# Patient Record
Sex: Female | Born: 1950 | Race: White | Hispanic: No | Marital: Married | Smoking: Never smoker
Health system: Southern US, Community
[De-identification: ages and names within clinical notes are randomized; demographics above are authoritative.]

## PROBLEM LIST (undated history)

## (undated) DIAGNOSIS — E7800 Pure hypercholesterolemia, unspecified: Secondary | ICD-10-CM

## (undated) DIAGNOSIS — B3781 Candidal esophagitis: Secondary | ICD-10-CM

## (undated) DIAGNOSIS — I341 Nonrheumatic mitral (valve) prolapse: Secondary | ICD-10-CM

## (undated) DIAGNOSIS — J45909 Unspecified asthma, uncomplicated: Secondary | ICD-10-CM

## (undated) DIAGNOSIS — M1990 Unspecified osteoarthritis, unspecified site: Secondary | ICD-10-CM

## (undated) DIAGNOSIS — K635 Polyp of colon: Secondary | ICD-10-CM

## (undated) DIAGNOSIS — K52831 Collagenous colitis: Secondary | ICD-10-CM

## (undated) DIAGNOSIS — K648 Other hemorrhoids: Secondary | ICD-10-CM

## (undated) HISTORY — PX: POLYPECTOMY: SHX149

## (undated) HISTORY — PX: WISDOM TOOTH EXTRACTION: SHX21

## (undated) HISTORY — DX: Pure hypercholesterolemia, unspecified: E78.00

## (undated) HISTORY — DX: Collagenous colitis: K52.831

## (undated) HISTORY — PX: VAGINAL HYSTERECTOMY: SUR661

## (undated) HISTORY — DX: Unspecified osteoarthritis, unspecified site: M19.90

## (undated) HISTORY — DX: Nonrheumatic mitral (valve) prolapse: I34.1

## (undated) HISTORY — DX: Other hemorrhoids: K64.8

## (undated) HISTORY — PX: COLONOSCOPY: SHX174

## (undated) HISTORY — DX: Unspecified asthma, uncomplicated: J45.909

## (undated) HISTORY — DX: Candidal esophagitis: B37.81

## (undated) HISTORY — DX: Polyp of colon: K63.5

## (undated) HISTORY — PX: TUBAL LIGATION: SHX77

## (undated) HISTORY — PX: EYE SURGERY: SHX253

---

## 2017-06-28 ENCOUNTER — Encounter: Payer: Self-pay | Admitting: Internal Medicine

## 2017-07-06 ENCOUNTER — Telehealth: Payer: Self-pay | Admitting: Internal Medicine

## 2017-07-06 NOTE — Telephone Encounter (Signed)
ROI faxed to [place]

## 2017-07-09 ENCOUNTER — Telehealth: Payer: Self-pay | Admitting: Internal Medicine

## 2017-07-09 NOTE — Telephone Encounter (Signed)
Received 32 Pages from [facility] [phone] mc

## 2017-08-26 ENCOUNTER — Ambulatory Visit (INDEPENDENT_AMBULATORY_CARE_PROVIDER_SITE_OTHER): Payer: Medicare Other | Admitting: Internal Medicine

## 2017-08-26 ENCOUNTER — Encounter: Payer: Self-pay | Admitting: Internal Medicine

## 2017-08-26 ENCOUNTER — Encounter (INDEPENDENT_AMBULATORY_CARE_PROVIDER_SITE_OTHER): Payer: Self-pay

## 2017-08-26 VITALS — BP 114/70 | HR 64 | Ht 63.39 in | Wt 131.0 lb

## 2017-08-26 DIAGNOSIS — Z86010 Personal history of colonic polyps: Secondary | ICD-10-CM

## 2017-08-26 DIAGNOSIS — K52831 Collagenous colitis: Secondary | ICD-10-CM | POA: Diagnosis not present

## 2017-08-26 DIAGNOSIS — K219 Gastro-esophageal reflux disease without esophagitis: Secondary | ICD-10-CM | POA: Diagnosis not present

## 2017-08-26 DIAGNOSIS — R197 Diarrhea, unspecified: Secondary | ICD-10-CM

## 2017-08-26 MED ORDER — DIPHENOXYLATE-ATROPINE 2.5-0.025 MG PO TABS: 1.0000 | ORAL_TABLET | Freq: Three times a day (TID) | ORAL | 0 refills | Status: DC | PRN

## 2017-08-26 MED ORDER — BUDESONIDE 3 MG PO CPEP: 9.0000 mg | ORAL_CAPSULE | Freq: Every day | ORAL | 3 refills | Status: DC

## 2017-08-26 NOTE — Patient Instructions (Signed)
We have sent the following medications to your pharmacy for you to pick up at your convenience:  Lomotil, Budesonide  Please follow up in one year

## 2017-08-26 NOTE — Progress Notes (Signed)
HISTORY OF PRESENT ILLNESS:  Tammy Barry is a 66 y.o. female with a history of collagenous colitis, GERD, and adenomatous colon polyps who presents today to establish GI care and wishes ongoing management of her GI conditions. Multiple outside records (33 pages) from [facility]) have been reviewed. Previous GI physician was Dr. Leverne Humbles M.D.. In summary, the patient underwent EGD in January 2010 to evaluate GERD symptoms and globus type sensation. Upper endoscopy was grossly unremarkable. Biopsies of the distal esophagus are said to show Candida esophagitis for which she was treated. She was also placed on PPI for GERD symptoms. Her problems resolved. She subsequently underwent colonoscopy in June 2011 to evaluate problems with diarrhea. She was found to have collagenous colitis. Testing for celiac disease was negative at that time. She has managed her collagenous colitis with varying dosages of budesonide. She has been somewhat reluctant to use the medication but has. At 9 mg daily she tolerates the medication and has fairly normal bowel habits. Most recently she prefers taking budesonide 3 mg daily and describes 2-3 soft or mushy bowel movements per day. Mostly in the morning. They may be urgency at times. This does make her apprehensive to exercise in the morning. Diarrhea with dietary indiscretion. She uses Pepto-Bismol intermittently. Stated that Imodium did not seem to help. Has not tried other antidiarrheals.'s interested. She has been off PPI for some time and states that she manages her GERD with diet. She requests medication refill. Of note, she did undergo her most recent colonoscopy 03/02/2016. She was found to have a 5 mm ascending colon polyp and internal hemorrhoids. Normal ileum. Pathology revealed tubular adenoma. Except for occasional reflux symptoms and diarrhea with dietary indiscretion the patient has no other active GI complaints. She  is accompanied today by her husband Tammy Barry  REVIEW OF SYSTEMS:  All non-GI ROS negative unless otherwise stated in history of present illness except for sinus allergy, arthritis, back pain, muscle cramps, heart murmur  Past Medical History:  Diagnosis Date  . Arthritis   . Asthma   . Candida esophagitis ([place])   . Collagenous colitis   . Colon polyp   . Hypercholesterolemia   . Internal hemorrhoids   . Mitral valve prolapse     Past Surgical History:  Procedure Laterality Date  . VAGINAL HYSTERECTOMY      Social History Alexx Giambra  reports that she has never smoked. She has never used smokeless tobacco. She reports that she drinks alcohol. She reports that she does not use drugs.  family history includes Colitis in her mother and sister; Diabetes in her other; Lung cancer in her father and mother.  Allergies  Allergen Reactions  . Codeine Nausea And Vomiting       PHYSICAL EXAMINATION: Vital signs: BP 114/70 (BP Location: Left Arm, Patient Position: Sitting, Cuff Size: Normal)   Pulse 64   Ht 5' 3.39" (1.61 m) Comment: ight measured without shoes  Wt 131 lb (59.4 kg)   BMI 22.92 kg/m   Constitutional: generally well-appearing, no acute distress Psychiatric: alert and oriented x3, cooperative Eyes: extraocular movements intact, anicteric, conjunctiva pink Mouth: oral pharynx moist, no lesions Neck: supple no lymphadenopathy Cardiovascular: heart regular rate and rhythm, no murmur Lungs: clear to auscultation bilaterally Abdomen: soft, nontender, nondistended, no obvious ascites, no peritoneal signs, normal bowel sounds, no organomegaly Rectal:Omitted Extremities: no clubbing cyanosis or lower extremity edema bilaterally Skin: no lesions on visible extremities Neuro: No focal deficits. Cranial nerves intact  ASSESSMENT:  #1. Biopsy-proven collagenous colitis June 2011. Evidence of the same on most recent colonoscopy March 2017. Managed with low-dose  budesonide #2. GERD. Being managed with diet #3. Adenomatous colon polyp March 2017   PLAN:  #1. Discussion on collagenous colitis #2. Prescribe budesonide 9 mg daily. Encouraged to take higher dose for a while as this normalizes her bowel habits. Then may taper to find the lowest dose that is acceptable #3. Prescribe Lomotil. Use as needed for diarrhea #4. Reflux precautions #5. Recall colonoscopy March 2022. Entered in computer #6. Routine GI follow-up one year. Sooner if needed

## 2017-09-08 NOTE — Telephone Encounter (Signed)
Received from [facility] 32 pages to Doctor Scarlette Shorts

## 2018-06-10 ENCOUNTER — Ambulatory Visit: Payer: Medicare Other | Admitting: Podiatry

## 2018-06-10 ENCOUNTER — Ambulatory Visit (INDEPENDENT_AMBULATORY_CARE_PROVIDER_SITE_OTHER): Payer: Medicare Other

## 2018-06-10 ENCOUNTER — Encounter: Payer: Self-pay | Admitting: Podiatry

## 2018-06-10 VITALS — BP 129/73 | HR 58 | Resp 16

## 2018-06-10 DIAGNOSIS — M2012 Hallux valgus (acquired), left foot: Secondary | ICD-10-CM | POA: Diagnosis not present

## 2018-06-10 DIAGNOSIS — M779 Enthesopathy, unspecified: Secondary | ICD-10-CM

## 2018-06-10 DIAGNOSIS — M2042 Other hammer toe(s) (acquired), left foot: Secondary | ICD-10-CM

## 2018-06-10 DIAGNOSIS — M2011 Hallux valgus (acquired), right foot: Secondary | ICD-10-CM

## 2018-06-10 MED ORDER — TRIAMCINOLONE ACETONIDE 10 MG/ML IJ SUSP
10.0000 mg | Freq: Once | INTRAMUSCULAR | Status: AC
  Administered 2018-06-10: 10 mg

## 2018-06-10 NOTE — Progress Notes (Signed)
   Subjective:    Patient ID: Tammy Barry, female    DOB: 06/27/1951, 67 y.o.   MRN: [id number]  HPI    Review of Systems  All other systems reviewed and are negative.      Objective:   Physical Exam        Assessment & Plan:

## 2018-06-10 NOTE — Progress Notes (Signed)
Subjective:   Patient ID: Tammy Barry, female   DOB: 67 y.o.   MRN: [id number]   HPI Patient presents with acute pain in the first metatarsal head right with a long-term structural bunion but admits that this is just occurred in that fashion over the last few weeks.  Left one also bothers her but not to the same degree as the right one and patient does not smoke and likes to be active   Review of Systems  All other systems reviewed and are negative.       Objective:  Physical Exam  Constitutional: She appears well-developed and well-nourished.  Cardiovascular: Intact distal pulses.  Pulmonary/Chest: Effort normal.  Musculoskeletal: Normal range of motion.  Neurological: She is alert.  Skin: Skin is warm.  Nursing note and vitals reviewed.   Neurovascular status intact muscle strength is adequate range of motion within normal limits with patient found to have inflammation and redness around the first MPJ right foot with fluid buildup around the joint and structural bunion deformity right over left foot.  Patient is found to have good digital perfusion and is well oriented x3     Assessment:  Acute capsulitis first MPJ right with structural bunion deformity hammertoe deformity right left foot     Plan:  H&P conditions reviewed and careful injection of the right first MPJ administered 3 mg Kenalog 5 mg Xylocaine and went ahead and I advised on wider shoes and considerations at one point in future for surgery depending on response  X-rays indicate structural bunion deformity bilateral right over left foot

## 2018-06-10 NOTE — Patient Instructions (Signed)

## 2018-07-19 ENCOUNTER — Other Ambulatory Visit: Payer: Self-pay | Admitting: Internal Medicine

## 2018-07-19 DIAGNOSIS — Z1231 Encounter for screening mammogram for malignant neoplasm of breast: Secondary | ICD-10-CM

## 2018-08-17 ENCOUNTER — Ambulatory Visit
Admission: RE | Admit: 2018-08-17 | Discharge: 2018-08-17 | Disposition: A | Discharge status: Home / Self Care | Payer: Medicare Other | Source: Ambulatory Visit | Attending: Internal Medicine | Admitting: Internal Medicine

## 2018-08-17 DIAGNOSIS — Z1231 Encounter for screening mammogram for malignant neoplasm of breast: Secondary | ICD-10-CM

## 2018-09-07 ENCOUNTER — Other Ambulatory Visit: Payer: Self-pay | Admitting: Podiatry

## 2018-09-07 ENCOUNTER — Encounter: Payer: Self-pay | Admitting: Podiatry

## 2018-09-07 ENCOUNTER — Ambulatory Visit (INDEPENDENT_AMBULATORY_CARE_PROVIDER_SITE_OTHER): Payer: Medicare Other

## 2018-09-07 ENCOUNTER — Ambulatory Visit: Payer: Medicare Other | Admitting: Podiatry

## 2018-09-07 DIAGNOSIS — M79675 Pain in left toe(s): Secondary | ICD-10-CM

## 2018-09-07 DIAGNOSIS — M2012 Hallux valgus (acquired), left foot: Secondary | ICD-10-CM

## 2018-09-07 DIAGNOSIS — M779 Enthesopathy, unspecified: Secondary | ICD-10-CM

## 2018-09-07 DIAGNOSIS — M2011 Hallux valgus (acquired), right foot: Secondary | ICD-10-CM

## 2018-09-07 MED ORDER — TRIAMCINOLONE ACETONIDE 10 MG/ML IJ SUSP
10.0000 mg | Freq: Once | INTRAMUSCULAR | Status: AC
  Administered 2018-09-07: 10 mg

## 2018-09-10 NOTE — Progress Notes (Signed)
Subjective:   Patient ID: Tammy Barry, female   DOB: 67 y.o.   MRN: [id number]   HPI Patient presents stating she developed a lot of pain in the top of her left foot and its been going on about a month and she does not remember specific injury   ROS      Objective:  Physical Exam  Neurovascular status intact with patient found to have inflammation around the left especially third metatarsal phalangeal joint with fluid buildup around the joint surface.  Patient is found to have mild swelling but no pitting edema     Assessment:  Inflammatory capsulitis third MPJ left is painful when palpated     Plan:  Patient.  Condition reviewed and today I went ahead reviewed x-ray.  I then did a proximal nerve block aspirated the third MPJ getting out a small amount of clear fluid injected with quarter cc Dexasone Kenalog along with plantar padding and shoe gear modifications.  Reappoint to recheck in 2 to 3 weeks  X-ray indicates there is no indications of stress fracture or advanced arthritis

## 2018-09-23 ENCOUNTER — Ambulatory Visit: Payer: Medicare Other | Admitting: Podiatry

## 2018-09-23 ENCOUNTER — Encounter: Payer: Self-pay | Admitting: Podiatry

## 2018-09-23 DIAGNOSIS — M779 Enthesopathy, unspecified: Secondary | ICD-10-CM

## 2018-09-23 NOTE — Progress Notes (Signed)
Subjective:   Patient ID: Tammy Barry, female   DOB: 67 y.o.   MRN: [id number]   HPI Patient states left foot is feeling quite a bit better but still giving some discomfort if she does not wear the right shoes   ROS      Objective:  Physical Exam  Neurovascular status intact with moderate capsulitis third MPJ left which is improved but present     Assessment:  Capsulitis third MPJ left present but improved     Plan:  Continue physical therapy anti-inflammatories rigid bottom shoes and patient will be seen back again if symptoms were to continue or get worse and was instructed on shoe gear modifications

## 2019-01-31 ENCOUNTER — Other Ambulatory Visit: Payer: Self-pay | Admitting: Internal Medicine

## 2019-07-20 ENCOUNTER — Other Ambulatory Visit: Payer: Self-pay | Admitting: Internal Medicine

## 2019-07-20 DIAGNOSIS — E785 Hyperlipidemia, unspecified: Secondary | ICD-10-CM

## 2019-07-28 ENCOUNTER — Ambulatory Visit
Admission: RE | Admit: 2019-07-28 | Discharge: 2019-07-28 | Disposition: A | Discharge status: Home / Self Care | Payer: Medicare Other | Source: Ambulatory Visit | Attending: Internal Medicine | Admitting: Internal Medicine

## 2019-07-28 DIAGNOSIS — E785 Hyperlipidemia, unspecified: Secondary | ICD-10-CM

## 2020-01-19 ENCOUNTER — Other Ambulatory Visit: Payer: Self-pay | Admitting: Internal Medicine

## 2020-01-19 DIAGNOSIS — Z1231 Encounter for screening mammogram for malignant neoplasm of breast: Secondary | ICD-10-CM

## 2020-01-21 ENCOUNTER — Ambulatory Visit: Payer: Medicare Other

## 2020-02-05 ENCOUNTER — Ambulatory Visit: Payer: Medicare Other

## 2020-03-05 ENCOUNTER — Other Ambulatory Visit: Payer: Self-pay

## 2020-03-05 ENCOUNTER — Ambulatory Visit
Admission: RE | Admit: 2020-03-05 | Discharge: 2020-03-05 | Disposition: A | Discharge status: Home / Self Care | Payer: Medicare Other | Source: Ambulatory Visit | Attending: Internal Medicine | Admitting: Internal Medicine

## 2020-03-05 DIAGNOSIS — Z1231 Encounter for screening mammogram for malignant neoplasm of breast: Secondary | ICD-10-CM

## 2020-04-16 ENCOUNTER — Other Ambulatory Visit: Payer: Self-pay | Admitting: Internal Medicine

## 2020-04-16 NOTE — Telephone Encounter (Signed)
Pt is scheduled for OV 6.24.21 and requested a refill for budesonide.

## 2020-06-06 ENCOUNTER — Ambulatory Visit: Payer: Medicare Other | Admitting: Internal Medicine

## 2020-06-06 ENCOUNTER — Encounter: Payer: Self-pay | Admitting: Internal Medicine

## 2020-06-06 VITALS — BP 118/62 | HR 64 | Ht 63.5 in | Wt 123.2 lb

## 2020-06-06 DIAGNOSIS — K219 Gastro-esophageal reflux disease without esophagitis: Secondary | ICD-10-CM

## 2020-06-06 DIAGNOSIS — Z86010 Personal history of colonic polyps: Secondary | ICD-10-CM

## 2020-06-06 DIAGNOSIS — K52839 Microscopic colitis, unspecified: Secondary | ICD-10-CM | POA: Diagnosis not present

## 2020-06-06 MED ORDER — BUDESONIDE 3 MG PO CPEP: 3.0000 mg | ORAL_CAPSULE | Freq: Every day | ORAL | 3 refills | Status: DC

## 2020-06-06 NOTE — Progress Notes (Signed)
HISTORY OF PRESENT ILLNESS:  Tammy Barry is a 69 y.o. female with past medical history as listed below who establish with this office September 2018 for ongoing management and treatment of collagenous colitis diagnosed on biopsy in [place] June 2011.  She also has a history of GERD managed with diet and adenomatous colon polyps colonoscopy March 2017.  She presents today for follow-up.  She tells me that she takes budesonide 3 mg daily.  May take an extra budesonide if she is going to be out in public.  As well, will use Imodium on demand, but infrequently.  She generally has 1-3 bowel movements daily.  Typically in the morning and formed.  No abdominal pain or other issues.  She does request medication refill.  She continues to manage her GERD with diet.  Really has had no problems recently.  She knows that she is due for surveillance colonoscopy next year.  She is accompanied today by her husband [name].  She has completed her Covid vaccination series  REVIEW OF SYSTEMS:  All non-GI ROS negative unless otherwise stated in HPI except for arthritis, ankle swelling  Past Medical History:  Diagnosis Date  . Arthritis   . Asthma   . Candida esophagitis ([place])   . Collagenous colitis   . Colon polyp   . Hypercholesterolemia   . Internal hemorrhoids   . Mitral valve prolapse     Past Surgical History:  Procedure Laterality Date  . VAGINAL HYSTERECTOMY      Social History Amiya Escamilla Cuen  reports that she has never smoked. She has never used smokeless tobacco. She reports current alcohol use. She reports that she does not use drugs.  family history includes Colitis in her mother and sister; Diabetes in an other family member; Lung cancer in her father and mother.  Allergies  Allergen Reactions  . Codeine Nausea And Vomiting       PHYSICAL EXAMINATION: Vital signs: BP 118/62   Pulse 64   Ht 5' 3.5" (1.613 m)   Wt 123 lb 4 oz (55.9 kg)   BMI 21.49 kg/m    Constitutional: generally well-appearing, no acute distress Psychiatric: alert and oriented x3, cooperative Eyes: extraocular movements intact, anicteric, conjunctiva pink Mouth: oral pharynx moist, no lesions Neck: supple no lymphadenopathy Cardiovascular: heart regular rate and rhythm, no murmur Lungs: clear to auscultation bilaterally Abdomen: soft, nontender, nondistended, no obvious ascites, no peritoneal signs, normal bowel sounds, no organomegaly Rectal: Omitted Extremities: no clubbing, cyanosis, or lower extremity edema bilaterally Skin: no lesions on visible extremities Neuro: No focal deficits. No asterixis.    ASSESSMENT:  1.  Biopsy-proven collagenous colitis June 2011.  Managed with low-dose budesonide and on-demand Imodium 2.  GERD.  Managed with diet 3.  History of adenomatous colon polyp March 2017.  No lower GI complaints   PLAN:  1.  Refill budesonide. 2.  Reflux precautions 3.  Surveillance colonoscopy next year 4.  Interval follow-up as needed

## 2020-06-06 NOTE — Patient Instructions (Signed)
We have sent the following medications to your pharmacy for you to pick up at your convenience:  Budosonide  Please follow up in one year

## 2020-09-05 HISTORY — PX: OTHER SURGICAL HISTORY: SHX169

## 2020-10-12 ENCOUNTER — Ambulatory Visit: Payer: Medicare Other | Attending: Internal Medicine

## 2020-10-12 DIAGNOSIS — Z23 Encounter for immunization: Secondary | ICD-10-CM

## 2021-02-27 ENCOUNTER — Encounter: Payer: Self-pay | Admitting: Internal Medicine

## 2021-03-14 ENCOUNTER — Encounter: Payer: Self-pay | Admitting: Internal Medicine

## 2021-05-01 ENCOUNTER — Other Ambulatory Visit (HOSPITAL_COMMUNITY): Payer: Self-pay | Admitting: Neurosurgery

## 2021-05-01 ENCOUNTER — Other Ambulatory Visit: Payer: Self-pay | Admitting: Neurosurgery

## 2021-05-01 DIAGNOSIS — M5416 Radiculopathy, lumbar region: Secondary | ICD-10-CM

## 2021-05-04 ENCOUNTER — Ambulatory Visit (HOSPITAL_COMMUNITY)
Admission: RE | Admit: 2021-05-04 | Discharge: 2021-05-04 | Disposition: A | Discharge status: Home / Self Care | Payer: Medicare Other | Source: Ambulatory Visit | Attending: Neurosurgery | Admitting: Neurosurgery

## 2021-05-04 ENCOUNTER — Other Ambulatory Visit: Payer: Self-pay

## 2021-05-04 DIAGNOSIS — M5416 Radiculopathy, lumbar region: Secondary | ICD-10-CM

## 2021-05-05 ENCOUNTER — Other Ambulatory Visit: Payer: Self-pay | Admitting: Neurosurgery

## 2021-05-23 ENCOUNTER — Encounter (HOSPITAL_COMMUNITY): Payer: Self-pay | Admitting: Neurosurgery

## 2021-05-23 ENCOUNTER — Other Ambulatory Visit: Payer: Self-pay

## 2021-05-23 NOTE — Progress Notes (Signed)
DUE TO COVID-19 ONLY ONE VISITOR IS ALLOWED TO COME WITH YOU AND STAY IN THE WAITING ROOM ONLY DURING PRE OP AND PROCEDURE DAY OF SURGERY.   PCP - Dr Marton Redwood Cardiologist - n/a  Chest x-ray - n/a EKG - n/a Stress Test - n/a ECHO - n/a Cardiac Cath - n/a  STOP now taking any Aspirin (unless otherwise instructed by your surgeon), Aleve, Naproxen, Ibuprofen, Motrin, Advil, Goody's, BC's, all herbal medications, fish oil, and all vitamins.   Coronavirus Screening Covid test is scheduled on DOS Do you have any of the following symptoms:  Cough yes/no: No Fever (>100.7F)  yes/no: No Runny nose yes/no: No Sore throat yes/no: No Difficulty breathing/shortness of breath  yes/no: No  Have you traveled in the last 14 days and where? Yes, [place]  Patient verbalized understanding of instructions that were given via phone.

## 2021-05-26 ENCOUNTER — Encounter (HOSPITAL_COMMUNITY): Payer: Self-pay | Admitting: Neurosurgery

## 2021-05-26 ENCOUNTER — Observation Stay (HOSPITAL_COMMUNITY)
Admission: RE | Admit: 2021-05-26 | Discharge: 2021-05-26 | Disposition: A | Discharge status: Home / Self Care | Payer: Medicare Other | Attending: Neurosurgery | Admitting: Neurosurgery

## 2021-05-26 ENCOUNTER — Ambulatory Visit (HOSPITAL_COMMUNITY): Payer: Medicare Other | Admitting: Certified Registered Nurse Anesthetist

## 2021-05-26 ENCOUNTER — Encounter (HOSPITAL_COMMUNITY)
Admission: RE | Disposition: A | Discharge status: Home / Self Care | Payer: Self-pay | Source: Home / Self Care | Attending: Neurosurgery

## 2021-05-26 ENCOUNTER — Other Ambulatory Visit: Payer: Self-pay

## 2021-05-26 ENCOUNTER — Ambulatory Visit (HOSPITAL_COMMUNITY): Payer: Medicare Other

## 2021-05-26 DIAGNOSIS — M5416 Radiculopathy, lumbar region: Secondary | ICD-10-CM | POA: Diagnosis not present

## 2021-05-26 DIAGNOSIS — J45909 Unspecified asthma, uncomplicated: Secondary | ICD-10-CM | POA: Diagnosis not present

## 2021-05-26 DIAGNOSIS — Z20822 Contact with and (suspected) exposure to covid-19: Secondary | ICD-10-CM | POA: Diagnosis not present

## 2021-05-26 DIAGNOSIS — M7138 Other bursal cyst, other site: Principal | ICD-10-CM | POA: Diagnosis present

## 2021-05-26 DIAGNOSIS — Z419 Encounter for procedure for purposes other than remedying health state, unspecified: Secondary | ICD-10-CM

## 2021-05-26 HISTORY — PX: LUMBAR LAMINECTOMY/DECOMPRESSION MICRODISCECTOMY: SHX5026

## 2021-05-26 LAB — BASIC METABOLIC PANEL
Anion gap: 11 (ref 5–15)
BUN: 15 mg/dL (ref 8–23)
CO2: 20 mmol/L — ABNORMAL LOW (ref 22–32)
Calcium: 9.2 mg/dL (ref 8.9–10.3)
Chloride: 104 mmol/L (ref 98–111)
Creatinine, Ser: 0.84 mg/dL (ref 0.44–1.00)
GFR, Estimated: >60 mL/min (ref >60)
Glucose, Bld: 99 mg/dL (ref 70–99)
Potassium: 3.9 mmol/L (ref 3.5–5.1)
Sodium: 135 mmol/L (ref 135–145)

## 2021-05-26 LAB — CBC WITH DIFFERENTIAL/PLATELET
Abs Immature Granulocytes: 0.06 10*3/uL (ref 0.00–0.07)
Basophils Absolute: 0 10*3/uL (ref 0.0–0.1)
Basophils Relative: 1 %
Eosinophils Absolute: 0 10*3/uL (ref 0.0–0.5)
Eosinophils Relative: 1 %
HCT: 40.3 % (ref 36.0–46.0)
Hemoglobin: 13.4 g/dL (ref 12.0–15.0)
Immature Granulocytes: 1 %
Lymphocytes Relative: 19 %
Lymphs Abs: 1.6 10*3/uL (ref 0.7–4.0)
MCH: 33.5 pg (ref 26.0–34.0)
MCHC: 33.3 g/dL (ref 30.0–36.0)
MCV: 100.8 fL — ABNORMAL HIGH (ref 80.0–100.0)
Monocytes Absolute: 0.6 10*3/uL (ref 0.1–1.0)
Monocytes Relative: 7 %
Neutro Abs: 6.3 10*3/uL (ref 1.7–7.7)
Neutrophils Relative %: 71 %
Platelets: 292 10*3/uL (ref 150–400)
RBC: 4 MIL/uL (ref 3.87–5.11)
RDW: 13.5 % (ref 11.5–15.5)
WBC: 8.6 10*3/uL (ref 4.0–10.5)
nRBC: 0 % (ref 0.0–0.2)

## 2021-05-26 LAB — SURGICAL PCR SCREEN
MRSA, PCR: NEGATIVE
Staphylococcus aureus: NEGATIVE

## 2021-05-26 LAB — SARS CORONAVIRUS 2 BY RT PCR (HOSPITAL ORDER, PERFORMED IN ~~LOC~~ HOSPITAL LAB): SARS Coronavirus 2: NEGATIVE

## 2021-05-26 SURGERY — LUMBAR LAMINECTOMY/DECOMPRESSION MICRODISCECTOMY 1 LEVEL
Anesthesia: General | Site: Back | Laterality: Bilateral

## 2021-05-26 MED ORDER — SODIUM CHLORIDE 0.9% FLUSH: 3.0000 mL | Freq: Two times a day (BID) | INTRAVENOUS | Status: DC

## 2021-05-26 MED ORDER — SUGAMMADEX SODIUM 200 MG/2ML IV SOLN
INTRAVENOUS | Status: DC | PRN
  Administered 2021-05-26: 200 mg via INTRAVENOUS

## 2021-05-26 MED ORDER — ONDANSETRON HCL 4 MG PO TABS: 4.0000 mg | ORAL_TABLET | Freq: Four times a day (QID) | ORAL | Status: DC | PRN

## 2021-05-26 MED ORDER — PHENYLEPHRINE 40 MCG/ML (10ML) SYRINGE FOR IV PUSH (FOR BLOOD PRESSURE SUPPORT)
PREFILLED_SYRINGE | INTRAVENOUS | Status: DC | PRN
  Administered 2021-05-26: 80 ug via INTRAVENOUS
  Administered 2021-05-26: 120 ug via INTRAVENOUS

## 2021-05-26 MED ORDER — THROMBIN 5000 UNITS EX SOLR
CUTANEOUS | Status: AC
  Filled 2021-05-26: qty 10000

## 2021-05-26 MED ORDER — SODIUM CHLORIDE 0.9% FLUSH: 3.0000 mL | INTRAVENOUS | Status: DC | PRN

## 2021-05-26 MED ORDER — OXYCODONE HCL 5 MG PO TABS: 5.0000 mg | ORAL_TABLET | ORAL | Status: DC | PRN

## 2021-05-26 MED ORDER — PHENYLEPHRINE HCL-NACL 10-0.9 MG/250ML-% IV SOLN: INTRAVENOUS | Status: DC | PRN

## 2021-05-26 MED ORDER — BUPIVACAINE HCL (PF) 0.25 % IJ SOLN
INTRAMUSCULAR | Status: DC | PRN
  Administered 2021-05-26: 20 mL

## 2021-05-26 MED ORDER — KETOROLAC TROMETHAMINE 30 MG/ML IJ SOLN
INTRAMUSCULAR | Status: DC | PRN
  Administered 2021-05-26: 30 mg via INTRAVENOUS

## 2021-05-26 MED ORDER — BUPIVACAINE HCL (PF) 0.25 % IJ SOLN
INTRAMUSCULAR | Status: AC
  Filled 2021-05-26: qty 30

## 2021-05-26 MED ORDER — MENTHOL 3 MG MT LOZG: 1.0000 | LOZENGE | OROMUCOSAL | Status: DC | PRN

## 2021-05-26 MED ORDER — DEXAMETHASONE SODIUM PHOSPHATE 10 MG/ML IJ SOLN
10.0000 mg | Freq: Once | INTRAMUSCULAR | Status: DC
  Filled 2021-05-26: qty 1

## 2021-05-26 MED ORDER — LACTATED RINGERS IV SOLN: INTRAVENOUS | Status: DC

## 2021-05-26 MED ORDER — KETOROLAC TROMETHAMINE 30 MG/ML IJ SOLN
INTRAMUSCULAR | Status: AC
  Filled 2021-05-26: qty 1

## 2021-05-26 MED ORDER — HYDROCODONE-ACETAMINOPHEN 10-325 MG PO TABS: 2.0000 | ORAL_TABLET | ORAL | Status: DC | PRN

## 2021-05-26 MED ORDER — HEMOSTATIC AGENTS (NO CHARGE) OPTIME
TOPICAL | Status: DC | PRN
  Administered 2021-05-26: 1 via TOPICAL

## 2021-05-26 MED ORDER — PHENYLEPHRINE 40 MCG/ML (10ML) SYRINGE FOR IV PUSH (FOR BLOOD PRESSURE SUPPORT)
PREFILLED_SYRINGE | INTRAVENOUS | Status: AC
  Filled 2021-05-26: qty 10

## 2021-05-26 MED ORDER — ORAL CARE MOUTH RINSE: 15.0000 mL | Freq: Once | OROMUCOSAL | Status: AC

## 2021-05-26 MED ORDER — HYDROCODONE-ACETAMINOPHEN 5-325 MG PO TABS: 1.0000 | ORAL_TABLET | ORAL | Status: DC | PRN

## 2021-05-26 MED ORDER — SODIUM CHLORIDE 0.9 % IV SOLN
250.0000 mL | INTRAVENOUS | Status: DC
  Administered 2021-05-26: 250 mL via INTRAVENOUS

## 2021-05-26 MED ORDER — MIDAZOLAM HCL 2 MG/2ML IJ SOLN
INTRAMUSCULAR | Status: AC
  Filled 2021-05-26: qty 2

## 2021-05-26 MED ORDER — ONDANSETRON HCL 4 MG/2ML IJ SOLN
INTRAMUSCULAR | Status: AC
  Filled 2021-05-26: qty 2

## 2021-05-26 MED ORDER — CHLORHEXIDINE GLUCONATE CLOTH 2 % EX PADS: 6.0000 | MEDICATED_PAD | Freq: Once | CUTANEOUS | Status: DC

## 2021-05-26 MED ORDER — ACETAMINOPHEN 325 MG PO TABS: 650.0000 mg | ORAL_TABLET | ORAL | Status: DC | PRN

## 2021-05-26 MED ORDER — CYCLOBENZAPRINE HCL 10 MG PO TABS
10.0000 mg | ORAL_TABLET | Freq: Three times a day (TID) | ORAL | Status: DC | PRN
  Administered 2021-05-26: 10 mg via ORAL

## 2021-05-26 MED ORDER — LIDOCAINE HCL (PF) 2 % IJ SOLN
INTRAMUSCULAR | Status: AC
  Filled 2021-05-26: qty 5

## 2021-05-26 MED ORDER — DROPERIDOL 2.5 MG/ML IJ SOLN: 0.6250 mg | Freq: Once | INTRAMUSCULAR | Status: DC | PRN

## 2021-05-26 MED ORDER — SAME 400 MG PO TABS: 400.0000 mg | ORAL_TABLET | Freq: Every evening | ORAL | Status: DC

## 2021-05-26 MED ORDER — BUDESONIDE 3 MG PO CPEP: 3.0000 mg | ORAL_CAPSULE | Freq: Every day | ORAL | Status: DC

## 2021-05-26 MED ORDER — HYDROCODONE-ACETAMINOPHEN 5-325 MG PO TABS: 1.0000 | ORAL_TABLET | ORAL | 0 refills | Status: DC | PRN

## 2021-05-26 MED ORDER — ONDANSETRON HCL 4 MG/2ML IJ SOLN: 4.0000 mg | Freq: Four times a day (QID) | INTRAMUSCULAR | Status: DC | PRN

## 2021-05-26 MED ORDER — HYDROMORPHONE HCL 1 MG/ML IJ SOLN
0.2500 mg | INTRAMUSCULAR | Status: DC | PRN
  Administered 2021-05-26: 0.5 mg via INTRAVENOUS

## 2021-05-26 MED ORDER — FENTANYL CITRATE (PF) 250 MCG/5ML IJ SOLN
INTRAMUSCULAR | Status: DC | PRN
  Administered 2021-05-26: 100 ug via INTRAVENOUS
  Administered 2021-05-26: 50 ug via INTRAVENOUS

## 2021-05-26 MED ORDER — PROPOFOL 10 MG/ML IV BOLUS
INTRAVENOUS | Status: DC | PRN
  Administered 2021-05-26: 100 mg via INTRAVENOUS

## 2021-05-26 MED ORDER — CHLORHEXIDINE GLUCONATE 0.12 % MT SOLN
15.0000 mL | Freq: Once | OROMUCOSAL | Status: AC
  Administered 2021-05-26: 15 mL via OROMUCOSAL
  Filled 2021-05-26: qty 15

## 2021-05-26 MED ORDER — HYDROMORPHONE HCL 1 MG/ML IJ SOLN
INTRAMUSCULAR | Status: AC
  Administered 2021-05-26: 0.5 mg via INTRAVENOUS
  Filled 2021-05-26: qty 1

## 2021-05-26 MED ORDER — DEXAMETHASONE SODIUM PHOSPHATE 10 MG/ML IJ SOLN
INTRAMUSCULAR | Status: AC
  Filled 2021-05-26: qty 1

## 2021-05-26 MED ORDER — CYCLOBENZAPRINE HCL 10 MG PO TABS
ORAL_TABLET | ORAL | Status: AC
  Filled 2021-05-26: qty 1

## 2021-05-26 MED ORDER — ROCURONIUM BROMIDE 10 MG/ML (PF) SYRINGE
PREFILLED_SYRINGE | INTRAVENOUS | Status: AC
  Filled 2021-05-26: qty 10

## 2021-05-26 MED ORDER — MIDAZOLAM HCL 2 MG/2ML IJ SOLN
INTRAMUSCULAR | Status: DC | PRN
  Administered 2021-05-26: 2 mg via INTRAVENOUS

## 2021-05-26 MED ORDER — POLYVINYL ALCOHOL 1.4 % OP SOLN
1.0000 [drp] | Freq: Three times a day (TID) | OPHTHALMIC | Status: DC | PRN
  Filled 2021-05-26: qty 15

## 2021-05-26 MED ORDER — ROSUVASTATIN CALCIUM 5 MG PO TABS: 10.0000 mg | ORAL_TABLET | Freq: Every day | ORAL | Status: DC

## 2021-05-26 MED ORDER — LIDOCAINE 2% (20 MG/ML) 5 ML SYRINGE
INTRAMUSCULAR | Status: DC | PRN
  Administered 2021-05-26: 50 mg via INTRAVENOUS

## 2021-05-26 MED ORDER — PROGESTERONE MICRONIZED 100 MG PO CAPS
100.0000 mg | ORAL_CAPSULE | Freq: Every day | ORAL | Status: DC
  Filled 2021-05-26: qty 1

## 2021-05-26 MED ORDER — PHENOL 1.4 % MT LIQD: 1.0000 | OROMUCOSAL | Status: DC | PRN

## 2021-05-26 MED ORDER — OXYCODONE HCL 5 MG PO TABS
ORAL_TABLET | ORAL | Status: AC
  Filled 2021-05-26: qty 1

## 2021-05-26 MED ORDER — KETOROLAC TROMETHAMINE 15 MG/ML IJ SOLN
30.0000 mg | Freq: Four times a day (QID) | INTRAMUSCULAR | Status: DC
  Administered 2021-05-26: 30 mg via INTRAVENOUS
  Filled 2021-05-26: qty 2

## 2021-05-26 MED ORDER — ACETAMINOPHEN 650 MG RE SUPP: 650.0000 mg | RECTAL | Status: DC | PRN

## 2021-05-26 MED ORDER — HYDROMORPHONE HCL 1 MG/ML IJ SOLN
1.0000 mg | INTRAMUSCULAR | Status: DC | PRN
Start: 2021-05-26 — End: 2021-05-27

## 2021-05-26 MED ORDER — KETOROLAC TROMETHAMINE 30 MG/ML IJ SOLN
INTRAMUSCULAR | Status: AC
  Administered 2021-05-26: 15 mg
  Filled 2021-05-26: qty 1

## 2021-05-26 MED ORDER — FENTANYL CITRATE (PF) 250 MCG/5ML IJ SOLN
INTRAMUSCULAR | Status: AC
  Filled 2021-05-26: qty 5

## 2021-05-26 MED ORDER — OXYCODONE HCL 5 MG PO TABS
5.0000 mg | ORAL_TABLET | Freq: Once | ORAL | Status: AC
  Administered 2021-05-26: 5 mg via ORAL

## 2021-05-26 MED ORDER — ONDANSETRON HCL 4 MG/2ML IJ SOLN
INTRAMUSCULAR | Status: DC | PRN
  Administered 2021-05-26: 4 mg via INTRAVENOUS

## 2021-05-26 MED ORDER — DIPHENHYDRAMINE HCL 50 MG/ML IJ SOLN
INTRAMUSCULAR | Status: AC
  Filled 2021-05-26: qty 1

## 2021-05-26 MED ORDER — DIPHENHYDRAMINE HCL 50 MG/ML IJ SOLN
INTRAMUSCULAR | Status: DC | PRN
  Administered 2021-05-26: 12.5 mg via INTRAVENOUS

## 2021-05-26 MED ORDER — THROMBIN 5000 UNITS EX SOLR
CUTANEOUS | Status: DC | PRN
  Administered 2021-05-26 (×2): 5000 [IU] via TOPICAL

## 2021-05-26 MED ORDER — ROCURONIUM BROMIDE 10 MG/ML (PF) SYRINGE
PREFILLED_SYRINGE | INTRAVENOUS | Status: DC | PRN
  Administered 2021-05-26: 50 mg via INTRAVENOUS

## 2021-05-26 MED ORDER — MEPERIDINE HCL 25 MG/ML IJ SOLN: 6.2500 mg | INTRAMUSCULAR | Status: DC | PRN

## 2021-05-26 MED ORDER — 0.9 % SODIUM CHLORIDE (POUR BTL) OPTIME
TOPICAL | Status: DC | PRN
  Administered 2021-05-26: 1000 mL

## 2021-05-26 MED ORDER — PROPOFOL 10 MG/ML IV BOLUS
INTRAVENOUS | Status: AC
  Filled 2021-05-26: qty 20

## 2021-05-26 MED ORDER — CYCLOBENZAPRINE HCL 10 MG PO TABS
10.0000 mg | ORAL_TABLET | Freq: Three times a day (TID) | ORAL | 0 refills | Status: DC | PRN
Start: 2021-05-26 — End: 2023-11-09

## 2021-05-26 MED ORDER — LACTATED RINGERS IV SOLN: INTRAVENOUS | Status: DC | PRN

## 2021-05-26 MED ORDER — DEXAMETHASONE SODIUM PHOSPHATE 10 MG/ML IJ SOLN
INTRAMUSCULAR | Status: DC | PRN
  Administered 2021-05-26: 10 mg via INTRAVENOUS

## 2021-05-26 MED ORDER — MOMETASONE FURO-FORMOTEROL FUM 200-5 MCG/ACT IN AERO
2.0000 | INHALATION_SPRAY | Freq: Two times a day (BID) | RESPIRATORY_TRACT | Status: DC
  Administered 2021-05-26: 2 via RESPIRATORY_TRACT
  Filled 2021-05-26: qty 8.8

## 2021-05-26 MED ORDER — CEFAZOLIN SODIUM-DEXTROSE 2-4 GM/100ML-% IV SOLN
2.0000 g | INTRAVENOUS | Status: AC
  Administered 2021-05-26: 2 g via INTRAVENOUS
  Filled 2021-05-26: qty 100

## 2021-05-26 MED ORDER — CEFAZOLIN SODIUM-DEXTROSE 1-4 GM/50ML-% IV SOLN: 1.0000 g | Freq: Three times a day (TID) | INTRAVENOUS | Status: DC

## 2021-05-26 MED ORDER — GLYCOPYRROLATE PF 0.2 MG/ML IJ SOSY
PREFILLED_SYRINGE | INTRAMUSCULAR | Status: AC
  Filled 2021-05-26: qty 1

## 2021-05-26 SURGICAL SUPPLY — 48 items
BAG DECANTER FOR FLEXI CONT (MISCELLANEOUS) IMPLANT
BAND RUBBER #18 3X1/16 STRL (MISCELLANEOUS) ×6 IMPLANT
BENZOIN TINCTURE PRP APPL 2/3 (GAUZE/BANDAGES/DRESSINGS) ×3 IMPLANT
BLADE CLIPPER SURG (BLADE) IMPLANT
BUR CUTTER 7.0 ROUND (BURR) ×3 IMPLANT
CANISTER SUCT 3000ML PPV (MISCELLANEOUS) ×3 IMPLANT
CARTRIDGE OIL MAESTRO DRILL (MISCELLANEOUS) ×1 IMPLANT
CLOSURE WOUND 1/2 X4 (GAUZE/BANDAGES/DRESSINGS) ×1
COVER WAND RF STERILE (DRAPES) IMPLANT
DECANTER SPIKE VIAL GLASS SM (MISCELLANEOUS) ×3 IMPLANT
DERMABOND ADVANCED (GAUZE/BANDAGES/DRESSINGS) ×2
DERMABOND ADVANCED .7 DNX12 (GAUZE/BANDAGES/DRESSINGS) ×1 IMPLANT
DIFFUSER DRILL AIR PNEUMATIC (MISCELLANEOUS) ×3 IMPLANT
DRAPE HALF SHEET 40X57 (DRAPES) IMPLANT
DRAPE LAPAROTOMY 100X72X124 (DRAPES) ×3 IMPLANT
DRAPE MICROSCOPE LEICA (MISCELLANEOUS) ×3 IMPLANT
DRAPE SURG 17X23 STRL (DRAPES) ×6 IMPLANT
DRSG OPSITE POSTOP 4X6 (GAUZE/BANDAGES/DRESSINGS) ×3 IMPLANT
DURAPREP 26ML APPLICATOR (WOUND CARE) ×3 IMPLANT
ELECT REM PT RETURN 9FT ADLT (ELECTROSURGICAL) ×3
ELECTRODE REM PT RTRN 9FT ADLT (ELECTROSURGICAL) ×1 IMPLANT
GAUZE 4X4 16PLY RFD (DISPOSABLE) IMPLANT
GAUZE SPONGE 4X4 12PLY STRL (GAUZE/BANDAGES/DRESSINGS) IMPLANT
GLOVE BIO SURGEON STRL SZ 6.5 (GLOVE) ×2 IMPLANT
GLOVE BIO SURGEONS STRL SZ 6.5 (GLOVE) ×1
GLOVE ECLIPSE 9.0 STRL (GLOVE) ×3 IMPLANT
GLOVE EXAM NITRILE XL STR (GLOVE) IMPLANT
GLOVE SURG UNDER POLY LF SZ6.5 (GLOVE) ×3 IMPLANT
GOWN STRL REUS W/ TWL LRG LVL3 (GOWN DISPOSABLE) ×1 IMPLANT
GOWN STRL REUS W/ TWL XL LVL3 (GOWN DISPOSABLE) ×1 IMPLANT
GOWN STRL REUS W/TWL 2XL LVL3 (GOWN DISPOSABLE) ×6 IMPLANT
GOWN STRL REUS W/TWL LRG LVL3 (GOWN DISPOSABLE) ×2
GOWN STRL REUS W/TWL XL LVL3 (GOWN DISPOSABLE) ×2
KIT BASIN OR (CUSTOM PROCEDURE TRAY) ×3 IMPLANT
KIT TURNOVER KIT B (KITS) ×3 IMPLANT
NEEDLE HYPO 22GX1.5 SAFETY (NEEDLE) ×3 IMPLANT
NEEDLE SPNL 22GX3.5 QUINCKE BK (NEEDLE) ×3 IMPLANT
NS IRRIG 1000ML POUR BTL (IV SOLUTION) ×3 IMPLANT
OIL CARTRIDGE MAESTRO DRILL (MISCELLANEOUS) ×3
PACK LAMINECTOMY NEURO (CUSTOM PROCEDURE TRAY) ×3 IMPLANT
PAD ARMBOARD 7.5X6 YLW CONV (MISCELLANEOUS) ×9 IMPLANT
SPONGE SURGIFOAM ABS GEL SZ50 (HEMOSTASIS) ×3 IMPLANT
STRIP CLOSURE SKIN 1/2X4 (GAUZE/BANDAGES/DRESSINGS) ×2 IMPLANT
SUT VIC AB 2-0 CT1 18 (SUTURE) ×3 IMPLANT
SUT VIC AB 3-0 SH 8-18 (SUTURE) ×3 IMPLANT
TOWEL GREEN STERILE (TOWEL DISPOSABLE) ×3 IMPLANT
TOWEL GREEN STERILE FF (TOWEL DISPOSABLE) ×3 IMPLANT
WATER STERILE IRR 1000ML POUR (IV SOLUTION) ×3 IMPLANT

## 2021-05-26 NOTE — H&P (Signed)
Modelle Elaysha Bevard is an 70 y.o. female.   Chief Complaint: Left leg pain HPI: 70 year old female with severe bilateral lower extremity pain left greater than right.  Symptoms began a few months ago without any precipitating event.  Patient with severe left lower extremity radicular pain numbness and some weakness.  She has intermittent symptoms on the right.  Work-up demonstrates evidence of newly discovered large posterior synovial cyst at L5-S1 causing marked thecal sac and nerve root compression left greater than right.  No evidence of instability with flexion or extension.  Mild to moderate degenerative changes above this level.  Plan is for bilateral decompression and resection of cyst in hopes of improving her symptoms.  Past Medical History:  Diagnosis Date   Arthritis    hands/knees/back   Asthma    uses inhalers prn   Candida esophagitis ([facility])    Collagenous colitis    Colon polyp    Hypercholesterolemia    Internal hemorrhoids    Mitral valve prolapse    Never had any problems per patient 05/23/21    Past Surgical History:  Procedure Laterality Date   elbow surgery  09/05/2020   tendon repair   EYE SURGERY Bilateral    cataracts removed   TUBAL LIGATION     VAGINAL HYSTERECTOMY     WISDOM TOOTH EXTRACTION      Family History  Problem Relation Age of Onset   Lung cancer Mother    Colitis Mother        microscopic   Lung cancer Father    Colitis Sister    Diabetes Other        on fathers side-unknown who   Social History:  reports that she has never smoked. She has never used smokeless tobacco. She reports current alcohol use of about 14.0 standard drinks of alcohol per week. She reports that she does not use drugs.  Allergies:  Allergies  Allergen Reactions   Codeine Nausea And Vomiting    Medications Prior to Admission  Medication Sig Dispense Refill   budesonide (ENTOCORT EC) 3 MG 24 hr capsule Take 1 capsule (3 mg total) by mouth daily. (Patient  taking differently: Take 3 mg by mouth in the morning, at noon, and at bedtime.) 90 capsule 3   budesonide-formoterol (SYMBICORT) 160-4.5 MCG/ACT inhaler Inhale 2 puffs into the lungs 2 (two) times daily.     carboxymethylcellulose (REFRESH PLUS) 0.5 % SOLN Place 1-2 drops into both eyes 3 (three) times daily as needed (dry/irritated eyes).     diclofenac Sodium (VOLTAREN) 1 % GEL Apply 1 application topically 4 (four) times daily as needed (pain.).     loperamide (IMODIUM) 2 MG capsule Take 2 mg by mouth 4 (four) times daily as needed for diarrhea or loose stools.     progesterone (PROMETRIUM) 100 MG capsule Take 100 mg by mouth at bedtime.     rosuvastatin (CRESTOR) 10 MG tablet Take 10 mg by mouth at bedtime.     S-Adenosylmethionine (SAME) 400 MG TABS Take 400 mg by mouth every evening.      Results for orders placed or performed during the hospital encounter of 05/26/21 (from the past 48 hour(s))  SARS Coronavirus 2 by RT PCR (hospital order, performed in [facility] hospital lab) Nasopharyngeal Nasopharyngeal Swab     Status: None   Collection Time: 05/26/21 12:44 PM   Specimen: Nasopharyngeal Swab  Result Value Ref Range   SARS Coronavirus 2 NEGATIVE NEGATIVE    Comment: (NOTE) SARS-CoV-2  target nucleic acids are NOT DETECTED.  The SARS-CoV-2 RNA is generally detectable in upper and lower respiratory specimens during the acute phase of infection. The lowest concentration of SARS-CoV-2 viral copies this assay can detect is 250 copies / mL. A negative result does not preclude SARS-CoV-2 infection and should not be used as the sole basis for treatment or other patient management decisions.  A negative result may occur with improper specimen collection / handling, submission of specimen other than nasopharyngeal swab, presence of viral mutation(s) within the areas targeted by this assay, and inadequate number of viral copies (<250 copies / mL). A negative result must be combined with  clinical observations, patient history, and epidemiological information.  Fact Sheet for Patients:   StrictlyIdeas.no  Fact Sheet for Healthcare Providers: BankingDealers.co.za  This test is not yet approved or  cleared by the [place] FDA and has been authorized for detection and/or diagnosis of SARS-CoV-2 by FDA under an Emergency Use Authorization (EUA).  This EUA will remain in effect (meaning this test can be used) for the duration of the COVID-19 declaration under Section 564(b)(1) of the Act, 21 U.S.C. section 360bbb-3(b)(1), unless the authorization is terminated or revoked sooner.  Performed at [facility], [place] [address], [place], [place] [address]   Basic metabolic panel     Status: Abnormal   Collection Time: 05/26/21  1:12 PM  Result Value Ref Range   Sodium 135 135 - 145 mmol/L   Potassium 3.9 3.5 - 5.1 mmol/L   Chloride 104 98 - 111 mmol/L   CO2 20 (L) 22 - 32 mmol/L   Glucose, Bld 99 70 - 99 mg/dL    Comment: Glucose reference range applies only to samples taken after fasting for at least 8 hours.   BUN 15 8 - 23 mg/dL   Creatinine, Ser 0.84 0.44 - 1.00 mg/dL   Calcium 9.2 8.9 - 10.3 mg/dL   GFR, Estimated >60 >60 mL/min    Comment: (NOTE) Calculated using the CKD-EPI Creatinine Equation (2021)    Anion gap 11 5 - 15    Comment: Performed at [facility] [address], [place], [place] [address]  CBC WITH DIFFERENTIAL     Status: Abnormal   Collection Time: 05/26/21  1:12 PM  Result Value Ref Range   WBC 8.6 4.0 - 10.5 K/uL   RBC 4.00 3.87 - 5.11 MIL/uL   Hemoglobin 13.4 12.0 - 15.0 g/dL   HCT 40.3 36.0 - 46.0 %   MCV 100.8 (H) 80.0 - 100.0 fL   MCH 33.5 26.0 - 34.0 pg   MCHC 33.3 30.0 - 36.0 g/dL   RDW 13.5 11.5 - 15.5 %   Platelets 292 150 - 400 K/uL   nRBC 0.0 0.0 - 0.2 %   Neutrophils Relative % 71 %   Neutro Abs 6.3 1.7 - 7.7 K/uL   Lymphocytes Relative 19 %   Lymphs Abs 1.6  0.7 - 4.0 K/uL   Monocytes Relative 7 %   Monocytes Absolute 0.6 0.1 - 1.0 K/uL   Eosinophils Relative 1 %   Eosinophils Absolute 0.0 0.0 - 0.5 K/uL   Basophils Relative 1 %   Basophils Absolute 0.0 0.0 - 0.1 K/uL   Immature Granulocytes 1 %   Abs Immature Granulocytes 0.06 0.00 - 0.07 K/uL    Comment: Performed at [facility] [address] [place], [place] [address]   No results found.  Pertinent items noted in HPI  and remainder of comprehensive ROS otherwise negative.  Blood pressure (!) 149/70, pulse 69, temperature 97.6 F (36.4 C), temperature source Oral, resp. rate 18, height 5\' 3"  (1.6 m), weight 55.3 kg, SpO2 100 %.  Patient is awake and alert.  She is oriented and appropriate.  Motor and sensory function reveal weakness of her left gastrocnemius muscle grading at 4/5 and decreased sensation in her left S1 and L5 dermatomes.  Deep tendon reflexes are normal active except her Achilles reflexes are absent bilaterally.  Gait is antalgic.  Posture is normal peer examination head ears eyes nose throat is unremarked.  Chest and abdomen are benign.  Extremities are free from injury or deformity. 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  Plan bilateral L5S1 decompressive laminotomies and resection of synovial cyst.  Risks and benefits been explained.  Patient wishes to proceed.  Mallie Mussel A Abbegale Stehle 05/26/2021, 2:58 PM

## 2021-05-26 NOTE — Op Note (Signed)
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  Asst. Surgeon: Milas Gain, NP  Anesthesia: General  Indication: 70 year old female with bilateral lower extremity symptoms left greater than right.  Work-up demonstrates evidence of significant bilateral L5S1 synovial cyst with marked compression of thecal sac and S1 nerve roots bilaterally.  Patient presents now for bilateral laminotomies and resection of synovial cyst in hopes of improving her symptoms.  Operative note: After induction anesthesia, patient position prone onto Wilson frame appropriate padded.  Lumbar region prepped and draped sterilely.  Incision made overlying L5-S1.  Dissection performed on the left.  Retractor placed.  X-ray taken.  Level confirmed.  Laminotomy then performed using high-speed drill and Kerrison rongeurs.  Ligament flavum elevated and resected.  Microscope brought in field.  Adherent synovial cyst was encountered in the left side.  This was gently dissected free from underlying thecal sac and left S1 nerve root and removed in a piecemeal fashion using Kerrison rongeurs.  The entire cyst was completely resected.  There was no evidence of injury to the thecal sac and nerve roots.  Wound was then irrigated.  Gelfoam was placed topically.  Foramen of both L5 and S1 nerve roots were inspected and found to be free of any compression or residual cyst.  Attention placed to the right side.  Once again a dissection was performed of the right side.  Retractor placed.  Laminotomy performed.  Ligament flavum elevated and resected.  Synovial cyst was dissected free using the microscope for microdissection.  The cyst was completely  resected.  The thecal sac and nerve roots were well decompressed.  There was no evidence of injury to the thecal sac or nerve roots.  Wound is then irrigated.  Gelfoam was placed over the thecal sac for hemostasis.  Wounds were closed in layers of Vicryl sutures.  Steri-Strips and sterile dressing were applied.  No apparent complications.  Patient tolerated the procedure well and she returns to recovery room postop.

## 2021-05-26 NOTE — Brief Op Note (Signed)
05/26/2021  4:39 PM  PATIENT:  Tammy Barry  70 y.o. female  PRE-OPERATIVE DIAGNOSIS:  synovial cyst  POST-OPERATIVE DIAGNOSIS:  synovial cyst  PROCEDURE:  Procedure(s): Laminectomy for facet/synovial cyst - bilateral - Lumbar five-Sacral one (Bilateral)  SURGEON:  Surgeon(s) and Role:    * Earnie Larsson, MD - Primary    Kristeen Miss, MD - Assisting  PHYSICIAN ASSISTANT:   ASSISTANTSMearl Latin   ANESTHESIA:   general  EBL:  100 mL   BLOOD ADMINISTERED:none  DRAINS: none   LOCAL MEDICATIONS USED:  MARCAINE     SPECIMEN:  No Specimen  DISPOSITION OF SPECIMEN:  N/A  COUNTS:  YES  TOURNIQUET:  * No tourniquets in log *  DICTATION: .Dragon Dictation  PLAN OF CARE: Admit for overnight observation  PATIENT DISPOSITION:  PACU - hemodynamically stable.   Delay start of Pharmacological VTE agent (>24hrs) due to surgical blood loss or risk of bleeding: yes

## 2021-05-26 NOTE — Plan of Care (Signed)
Patient ready for discharge. Tolerated dinner well. No complaints of further nausea noted. Discharge instruction given and verbalized understanding.

## 2021-05-26 NOTE — Anesthesia Postprocedure Evaluation (Signed)
Anesthesia Post Note  Patient: Tammy Barry  Procedure(s) Performed: Laminectomy for facet/synovial cyst - bilateral - Lumbar five-Sacral one (Bilateral: Back)     Patient location during evaluation: PACU Anesthesia Type: General Level of consciousness: awake and alert Pain management: pain level controlled Vital Signs Assessment: post-procedure vital signs reviewed and stable Respiratory status: spontaneous breathing, nonlabored ventilation and respiratory function stable Cardiovascular status: blood pressure returned to baseline and stable Postop Assessment: no apparent nausea or vomiting Anesthetic complications: no   No notable events documented.  Last Vitals:  Vitals:   05/26/21 1705 05/26/21 1720  BP: 136/72 136/87  Pulse: 71 78  Resp: 12 15  Temp:    SpO2: 96% 100%    Last Pain:  Vitals:   05/26/21 1710  TempSrc:   PainSc: 7                  Aneeka Bowden,W. EDMOND

## 2021-05-26 NOTE — Discharge Summary (Signed)
Physician Discharge Summary  Patient ID: Tammy Barry MRN: [id number] DOB/AGE: 1951/09/25 70 y.o.  Admit date: 05/26/2021 Discharge date: 05/26/2021  Admission Diagnoses:  Discharge Diagnoses:  Active Problems:   Synovial cyst of lumbar facet joint   Discharged Condition: good  Hospital Course: Patient admitted to the hospital where she underwent uncomplicated bilateral lumbar decompressive laminotomies and resection of synovial cyst.  Postoperatively doing very well.  Preoperative back and lower extremity pain much improved.  Standing ambulating and voiding without difficulty.  Ready for discharge home.  Consults:   Significant Diagnostic Studies:   Treatments:   Discharge Exam: Blood pressure (!) 144/67, pulse 61, temperature 97.8 F (36.6 C), resp. rate 15, height 5\' 3"  (1.6 m), weight 55.3 kg, SpO2 98 %. Awake and alert.  Oriented and appropriate.  Motor and sensory function intact.  Wound clean and dry.  Chest and abdomen benign.  Disposition: Discharge disposition: 01-Home or Self Care        Allergies as of 05/26/2021       Reactions   Codeine Nausea And Vomiting        Medication List     TAKE these medications    budesonide 3 MG 24 hr capsule Commonly known as: ENTOCORT EC Take 1 capsule (3 mg total) by mouth daily. What changed: when to take this   budesonide-formoterol 160-4.5 MCG/ACT inhaler Commonly known as: SYMBICORT Inhale 2 puffs into the lungs 2 (two) times daily.   carboxymethylcellulose 0.5 % Soln Commonly known as: REFRESH PLUS Place 1-2 drops into both eyes 3 (three) times daily as needed (dry/irritated eyes).   cyclobenzaprine 10 MG tablet Commonly known as: FLEXERIL Take 1 tablet (10 mg total) by mouth 3 (three) times daily as needed for muscle spasms.   diclofenac Sodium 1 % Gel Commonly known as: VOLTAREN Apply 1 application topically 4 (four) times daily as needed (pain.).   HYDROcodone-acetaminophen 5-325 MG  tablet Commonly known as: NORCO/VICODIN Take 1 tablet by mouth every 4 (four) hours as needed for moderate pain ((score 4 to 6)).   loperamide 2 MG capsule Commonly known as: IMODIUM Take 2 mg by mouth 4 (four) times daily as needed for diarrhea or loose stools.   progesterone 100 MG capsule Commonly known as: PROMETRIUM Take 100 mg by mouth at bedtime.   rosuvastatin 10 MG tablet Commonly known as: CRESTOR Take 10 mg by mouth at bedtime.   SAMe 400 MG Tabs Take 400 mg by mouth every evening.         Signed: Cooper Render Londyn Hotard 05/26/2021, 6:31 PM

## 2021-05-26 NOTE — Discharge Instructions (Addendum)
Wound Care Keep incision covered and dry for two days.   Do not put any creams, lotions, or ointments on incision. Leave steri-strips on back.  They will fall off by themselves.  Activity Walk each and every day, increasing distance each day. No lifting greater than 5 lbs.  Avoid excessive neck motion. No driving for 2 weeks; may ride as a passenger locally.  Diet Resume your normal diet.   Return to Work Will be discussed at you follow up appointment.  Call Your Doctor If Any of These Occur Redness, drainage, or swelling at the wound.  Temperature greater than 101 degrees. Severe pain not relieved by pain medication. Incision starts to come apart. Follow Up Appt Call today for appointment in 1-2 weeks [phone]) or for problems.  If you have any hardware placed in your spine, you will need an x-ray before your appointment.  

## 2021-05-26 NOTE — Anesthesia Procedure Notes (Signed)
Procedure Name: Intubation Date/Time: 05/26/2021 3:58 PM Performed by: Kathryne Hitch, CRNA Pre-anesthesia Checklist: Patient identified, Emergency Drugs available, Suction available and Patient being monitored Patient Re-evaluated:Patient Re-evaluated prior to induction Oxygen Delivery Method: Circle system utilized Preoxygenation: Pre-oxygenation with 100% oxygen Induction Type: IV induction Ventilation: Mask ventilation without difficulty Laryngoscope Size: Miller and 3 Grade View: Grade I Tube type: Oral Tube size: 7.0 mm Number of attempts: 1 Airway Equipment and Method: Stylet and Oral airway Placement Confirmation: ETT inserted through vocal cords under direct vision, positive ETCO2 and breath sounds checked- equal and bilateral Secured at: 21 cm Tube secured with: Tape Dental Injury: Teeth and Oropharynx as per pre-operative assessment

## 2021-05-26 NOTE — Transfer of Care (Signed)
Immediate Anesthesia Transfer of Care Note  Patient: Tammy Barry  Procedure(s) Performed: Laminectomy for facet/synovial cyst - bilateral - Lumbar five-Sacral one (Bilateral: Back)  Patient Location: PACU  Anesthesia Type:General  Level of Consciousness: drowsy and patient cooperative  Airway & Oxygen Therapy: Patient Spontanous Breathing and Patient connected to face mask oxygen  Post-op Assessment: Report given to RN and Post -op Vital signs reviewed and stable  Post vital signs: Reviewed and stable  Last Vitals:  Vitals Value Taken Time  BP 127/81 05/26/21 1648  Temp    Pulse 87 05/26/21 1650  Resp 16 05/26/21 1650  SpO2 100 % 05/26/21 1650  Vitals shown include unvalidated device data.  Last Pain:  Vitals:   05/26/21 1301  TempSrc:   PainSc: 3       Patients Stated Pain Goal: 1 (49/20/10 0712)  Complications: No notable events documented.

## 2021-05-26 NOTE — Anesthesia Preprocedure Evaluation (Addendum)
Anesthesia Evaluation  Patient identified by MRN, date of birth, ID band Patient awake    Reviewed: Allergy & Precautions, NPO status , Patient's Chart, lab work & pertinent test results  Airway Mallampati: II  TM Distance: >3 FB Neck ROM: Full    Dental no notable dental hx. (+) Dental Advisory Given, Teeth Intact   Pulmonary asthma ,    Pulmonary exam normal breath sounds clear to auscultation       Cardiovascular negative cardio ROS Normal cardiovascular exam Rhythm:Regular Rate:Normal     Neuro/Psych negative neurological ROS     GI/Hepatic negative GI ROS, Neg liver ROS,   Endo/Other  negative endocrine ROS  Renal/GU negative Renal ROS     Musculoskeletal  (+) Arthritis ,   Abdominal   Peds  Hematology negative hematology ROS (+)   Anesthesia Other Findings   Reproductive/Obstetrics                            Anesthesia Physical Anesthesia Plan  ASA: 2  Anesthesia Plan: General   Post-op Pain Management:    Induction: Intravenous  PONV Risk Score and Plan: 4 or greater and Ondansetron, Dexamethasone, Treatment may vary due to age or medical condition, Midazolam and Diphenhydramine  Airway Management Planned: Oral ETT  Additional Equipment: None  Intra-op Plan:   Post-operative Plan: Extubation in OR  Informed Consent: I have reviewed the patients History and Physical, chart, labs and discussed the procedure including the risks, benefits and alternatives for the proposed anesthesia with the patient or authorized representative who has indicated his/her understanding and acceptance.     Dental advisory given  Plan Discussed with: CRNA  Anesthesia Plan Comments:        Anesthesia Quick Evaluation

## 2021-05-27 ENCOUNTER — Encounter (HOSPITAL_COMMUNITY): Payer: Self-pay | Admitting: Neurosurgery

## 2021-05-30 ENCOUNTER — Ambulatory Visit (AMBULATORY_SURGERY_CENTER): Payer: Medicare Other

## 2021-05-30 ENCOUNTER — Encounter: Payer: Self-pay | Admitting: Internal Medicine

## 2021-05-30 VITALS — Ht 63.0 in | Wt 122.0 lb

## 2021-05-30 DIAGNOSIS — Z86010 Personal history of colonic polyps: Secondary | ICD-10-CM

## 2021-05-30 MED ORDER — NA SULFATE-K SULFATE-MG SULF 17.5-3.13-1.6 GM/177ML PO SOLN: 1.0000 | Freq: Once | ORAL | 0 refills | Status: AC

## 2021-05-30 NOTE — Progress Notes (Signed)
No egg or soy allergy known to patient  No issues with past sedation with any surgeries or procedures Patient denies ever being told they had issues or difficulty with intubation  No FH of Malignant Hyperthermia No diet pills per patient No home 02 use per patient  No blood thinners per patient  Pt denies issues with constipation  No A fib or A flutter  EMMI video to pt or via [place] 19 guidelines implemented in PV today with Pt and RN  Pt is fully vaccinated  for Covid   Virtual previsit  NO PA's for preps discussed with pt In PV today  Discussed with pt there will be an out-of-pocket cost for prep and that varies from $0 to 70 dollars   Due to the COVID-19 pandemic we are asking patients to follow certain guidelines.  Pt aware of COVID protocols and [facility] guidelines

## 2021-06-10 ENCOUNTER — Encounter: Payer: Self-pay | Admitting: Internal Medicine

## 2021-06-10 ENCOUNTER — Ambulatory Visit (AMBULATORY_SURGERY_CENTER): Payer: Medicare Other | Admitting: Internal Medicine

## 2021-06-10 ENCOUNTER — Other Ambulatory Visit: Payer: Self-pay

## 2021-06-10 VITALS — BP 114/63 | HR 68 | Temp 98.9°F | Resp 14 | Ht 63.0 in | Wt 122.0 lb

## 2021-06-10 DIAGNOSIS — K52839 Microscopic colitis, unspecified: Secondary | ICD-10-CM

## 2021-06-10 DIAGNOSIS — Z86010 Personal history of colonic polyps: Secondary | ICD-10-CM

## 2021-06-10 DIAGNOSIS — D123 Benign neoplasm of transverse colon: Secondary | ICD-10-CM | POA: Diagnosis not present

## 2021-06-10 DIAGNOSIS — R197 Diarrhea, unspecified: Secondary | ICD-10-CM

## 2021-06-10 MED ORDER — SODIUM CHLORIDE 0.9 % IV SOLN: 500.0000 mL | Freq: Once | INTRAVENOUS | Status: DC

## 2021-06-10 NOTE — Progress Notes (Signed)
Called to room to assist during endoscopic procedure.  Patient ID and intended procedure confirmed with present staff. Received instructions for my participation in the procedure from the performing physician.  

## 2021-06-10 NOTE — Progress Notes (Signed)
Pt's states no medical or surgical changes since previsit or office visit. 

## 2021-06-10 NOTE — Progress Notes (Signed)
Report to PACU, RN, vss, BBS= Clear.  

## 2021-06-10 NOTE — Op Note (Signed)
[facility] Patient Name: Tammy Barry Procedure Date: 06/10/2021 1:30 PM MRN: [id number] Endoscopist: Docia Chuck. Henrene Pastor , MD Age: 70 Referring MD:  Date of Birth: 04/23/1951 Gender: Female Account #: [id number] Procedure:                Colonoscopy with cold snare polypectomy x 1; with                            biopsies Indications:              High risk colon cancer surveillance: Personal                            history of non-advanced adenoma. Incidental                            diarrhea. History of microscopic colitis                            (colonoscopy 2011 in [place]); colonoscopy 2017                            (tubular adenoma, [place]) Medicines:                Monitored Anesthesia Care Procedure:                Pre-Anesthesia Assessment:                           - Prior to the procedure, a History and Physical                            was performed, and patient medications and                            allergies were reviewed. The patient's tolerance of                            previous anesthesia was also reviewed. The risks                            and benefits of the procedure and the sedation                            options and risks were discussed with the patient.                            All questions were answered, and informed consent                            was obtained. Prior Anticoagulants: The patient has                            taken no previous anticoagulant or antiplatelet  agents. ASA Grade Assessment: II - A patient with                            mild systemic disease. After reviewing the risks                            and benefits, the patient was deemed in                            satisfactory condition to undergo the procedure.                           After obtaining informed consent, the colonoscope                            was passed under direct vision. Throughout the                             procedure, the patient's blood pressure, pulse, and                            oxygen saturations were monitored continuously. The                            CF [id number] #[id number] was introduced through the anus                            and advanced to the the cecum, identified by                            appendiceal orifice and ileocecal valve. The                            terminal ileum, ileocecal valve, appendiceal                            orifice, and rectum were photographed. The quality                            of the bowel preparation was excellent. The                            colonoscopy was performed without difficulty. The                            patient tolerated the procedure well. The bowel                            preparation used was SUPREP via split dose                            instruction. Scope In: 1:39:25 PM Scope Out: 1:52:33 PM Scope Withdrawal Time: 0 hours 11 minutes 4 seconds  Total Procedure Duration: 0 hours 13  minutes 8 seconds  Findings:                 The terminal ileum appeared normal.                           A 3 mm polyp was found in the transverse colon. The                            polyp was removed with a cold snare. Resection and                            retrieval were complete.                           Internal hemorrhoids were found during                            retroflexion. There were a few sigmoid diverticula.                           The entire examined colon appeared otherwise normal                            on direct and retroflexion views. Biopsies for                            histology were taken with a cold forceps from the                            entire colon for evaluation of microscopic colitis. Complications:            No immediate complications. Estimated blood loss:                            None. Estimated Blood Loss:     Estimated blood loss: none. Impression:               -  The examined portion of the ileum was normal.                           - One 3 mm polyp in the transverse colon, removed                            with a cold snare. Resected and retrieved.                           - Sigmoid diverticulosis                           - Internal hemorrhoids.                           - The entire examined colon is otherwise normal on  direct and retroflexion views. Recommendation:           - Repeat colonoscopy in 5 years for surveillance.                           - Patient has a contact number available for                            emergencies. The signs and symptoms of potential                            delayed complications were discussed with the                            patient. Return to normal activities tomorrow.                            Written discharge instructions were provided to the                            patient.                           - Resume previous diet.                           - Continue present medications.                           - Await pathology results. Docia Chuck. Henrene Pastor, MD 06/10/2021 1:59:34 PM This report has been signed electronically.

## 2021-06-10 NOTE — Patient Instructions (Signed)
Handouts provided on polyps, diverticulosis and hemorrhoids.   Repeat colonoscopy in 5 years for surveillance.   YOU HAD AN ENDOSCOPIC PROCEDURE TODAY AT [facility]:   Refer to the procedure report that was given to you for any specific questions about what was found during the examination.  If the procedure report does not answer your questions, please call your gastroenterologist to clarify.  If you requested that your care partner not be given the details of your procedure findings, then the procedure report has been included in a sealed envelope for you to review at your convenience later.  YOU SHOULD EXPECT: Some feelings of bloating in the abdomen. Passage of more gas than usual.  Walking can help get rid of the air that was put into your GI tract during the procedure and reduce the bloating. If you had a lower endoscopy (such as a colonoscopy or flexible sigmoidoscopy) you may notice spotting of blood in your stool or on the toilet paper. If you underwent a bowel prep for your procedure, you may not have a normal bowel movement for a few days.  Please Note:  You might notice some irritation and congestion in your nose or some drainage.  This is from the oxygen used during your procedure.  There is no need for concern and it should clear up in a day or so.  SYMPTOMS TO REPORT IMMEDIATELY:  Following lower endoscopy (colonoscopy or flexible sigmoidoscopy):  Excessive amounts of blood in the stool  Significant tenderness or worsening of abdominal pains  Swelling of the abdomen that is new, acute  Fever of 100F or higher  For urgent or emergent issues, a gastroenterologist can be reached at any hour by calling [phone]. Do not use MyChart messaging for urgent concerns.    DIET:  We do recommend a small meal at first, but then you may proceed to your regular diet.  Drink plenty of fluids but you should avoid alcoholic beverages for 24 hours.  ACTIVITY:  You should  plan to take it easy for the rest of today and you should NOT DRIVE or use heavy machinery until tomorrow (because of the sedation medicines used during the test).    FOLLOW UP: Our staff will call the number listed on your records 48-72 hours following your procedure to check on you and address any questions or concerns that you may have regarding the information given to you following your procedure. If we do not reach you, we will leave a message.  We will attempt to reach you two times.  During this call, we will ask if you have developed any symptoms of COVID 19. If you develop any symptoms (ie: fever, flu-like symptoms, shortness of breath, cough etc.) before then, please call [phone].  If you test positive for Covid 19 in the 2 weeks post procedure, please call and report this information to [place].    If any biopsies were taken you will be contacted by phone or by letter within the next 1-3 weeks.  Please call us at [phone] if you have not heard about the biopsies in 3 weeks.    SIGNATURES/CONFIDENTIALITY: You and/or your care partner have signed paperwork which will be entered into your electronic medical record.  These signatures attest to the fact that that the information above on your After Visit Summary has been reviewed and is understood.  Full responsibility of the confidentiality of this discharge information lies with you and/or your care-partner.

## 2021-06-17 ENCOUNTER — Encounter: Payer: Self-pay | Admitting: Internal Medicine

## 2021-07-24 ENCOUNTER — Other Ambulatory Visit: Payer: Self-pay | Admitting: Internal Medicine

## 2021-09-29 ENCOUNTER — Encounter: Payer: Self-pay | Admitting: Plastic Surgery

## 2021-09-29 ENCOUNTER — Other Ambulatory Visit: Payer: Self-pay

## 2021-09-29 ENCOUNTER — Ambulatory Visit: Payer: Self-pay | Admitting: Plastic Surgery

## 2021-09-29 DIAGNOSIS — Z719 Counseling, unspecified: Secondary | ICD-10-CM | POA: Insufficient documentation

## 2021-09-29 NOTE — Progress Notes (Signed)
Botulinum Toxin and Filler Injection Procedure Note  Procedure: Cosmetic botulinum toxin and Filler administration  Pre-operative Diagnosis: Dynamic rhytides and midface volume loss  Post-operative Diagnosis: Same  Complications:  None  Brief history: The patient desires botulinum toxin injection of her forehead. I discussed with the patient this proposed procedure of botulinum toxin injections, which is customized depending on the particular needs of the patient. It is performed on facial rhytids as a temporary correction. The alternatives were discussed with the patient. The risks were addressed including bleeding, scarring, infection, damage to deeper structures, asymmetry, and chronic pain, which may occur infrequently after a procedure. The individual's choice to undergo a surgical procedure is based on the comparison of risks to potential benefits. Other risks include unsatisfactory results, brow ptosis, eyelid ptosis, allergic reaction, temporary paralysis, which should go away with time, bruising, blurring disturbances and delayed healing. Botulinum toxin injections do not arrest the aging process or produce permanent tightening of the eyelid.  Operative intervention maybe necessary to maintain the results of a blepharoplasty or botulinum toxin. The patient understands and wishes to proceed.  Procedure: The area was prepped with alcohol and dried with a clean gauze. Using a clean technique, the botulinum toxin was diluted with 1.25 cc of preservative-free normal saline which was slowly injected with an 18 gauge needle in a tuberculin syringes.  A 32 gauge needles were then used to inject the botulinum toxin. This mixture allow for an aliquot of 4 units per 0.1 cc in each injection site.    Subsequently the mixture was injected in the glabellar and forehead area with preservation of the temporal branch to the lateral eyebrow as well as into each lateral canthal area beginning from the lateral  orbital rim medial to the zygomaticus major in 3 separate areas. A total of ~30 Units of botulinum toxin was used. The forehead and glabellar area was injected with care to inject intramuscular only while holding pressure on the supratrochlear vessels in each area during each injection on either side of the medial corrugators. The injection proceeded vertically superiorly to the medial 2/3 of the frontalis muscle and superior 2/3 of the lateral frontalis, again with preservation of the frontal branch.  The midface area was injected at the 3 sub-regions of the mid-face: zygomaticomalar region, anteromedial cheek region, and submalar region for a total of one syringe on each side of the face. The technique used was serial puncture with equal injections in the sub-regions: the zygomaticomalar region, the anteromedial cheek, and the submalar region.  The upper and lower lips were injected with the Kysse to the patient's liking. No complications were noted. Light pressure was held for 5 minutes. She was instructed explicitly in post-operative care.  Botox LOT:  [id number] EXP:  7/24  J. Voluma XC x 2 LOT: [id number] EXP: 2022-06-07  Restylane Kysse LOT: [id number] EXP:2021-11-12      Patient ID: Tammy Barry, female    DOB: [birth year], 70 y.o.   MRN: [id number]   Chief Complaint  Patient presents with   cosmetic    The patient is a 70 year old female here for evaluation of her face and breasts.  She had filler put in her face several years ago but nothing in the last 2 years.  She had implants put in [place] in 1984.  She is not sure if they are above or below the muscle but she is aware that they are saline and feels that they may have changed.  Her last mammogram was  fall 2021 and was negative.  She has had elbow issues and spine cysts.  She is interested in implant exchange and facial rejuvenation.  On exam it looks like she has a capsular contracture of both breasts.  The implants appear well  like they are intact.  She has some ptosis of the bilateral breasts.  She has mid face volume loss with wrinkles of the forehead and periorbital area.  She looks very good for her age overall.  She has a lot of redness of her face.   Review of Systems  Constitutional: Negative.   HENT: Negative.    Eyes: Negative.   Respiratory: Negative.    Cardiovascular: Negative.   Gastrointestinal: Negative.   Endocrine: Negative.   Genitourinary: Negative.   Musculoskeletal: Negative.   Skin: Negative.   Neurological: Negative.   Hematological: Negative.   Psychiatric/Behavioral: Negative.     Past Medical History:  Diagnosis Date   Arthritis    hands/knees/back   Asthma    uses inhalers prn   Candida esophagitis ([place])    Collagenous colitis    Colon polyp    Hypercholesterolemia    Internal hemorrhoids    Mitral valve prolapse    Never had any problems per patient 05/23/21    Past Surgical History:  Procedure Laterality Date   COLONOSCOPY     elbow surgery  09/05/2020   tendon repair   EYE SURGERY Bilateral    cataracts removed   LUMBAR LAMINECTOMY/DECOMPRESSION MICRODISCECTOMY Bilateral 05/26/2021   Procedure: Laminectomy for facet/synovial cyst - bilateral - Lumbar five-Sacral one;  Surgeon: Earnie Larsson, MD;  Location: [facility];  Service: Neurosurgery;  Laterality: Bilateral;   POLYPECTOMY     TUBAL LIGATION     VAGINAL HYSTERECTOMY     WISDOM TOOTH EXTRACTION        Current Outpatient Medications:    budesonide (ENTOCORT EC) 3 MG 24 hr capsule, TAKE THREE CAPSULES BY MOUTH DAILY, Disp: 360 capsule, Rfl: 3   budesonide-formoterol (SYMBICORT) 160-4.5 MCG/ACT inhaler, Inhale 2 puffs into the lungs 2 (two) times daily., Disp: , Rfl:    carboxymethylcellulose (REFRESH PLUS) 0.5 % SOLN, Place 1-2 drops into both eyes 3 (three) times daily as needed (dry/irritated eyes)., Disp: , Rfl:    cyclobenzaprine (FLEXERIL) 10 MG tablet, Take 1 tablet (10 mg total) by mouth 3 (three) times  daily as needed for muscle spasms., Disp: 30 tablet, Rfl: 0   diclofenac Sodium (VOLTAREN) 1 % GEL, Apply 1 application topically 4 (four) times daily as needed (pain.)., Disp: , Rfl:    HYDROcodone-acetaminophen (NORCO/VICODIN) 5-325 MG tablet, Take 1 tablet by mouth every 4 (four) hours as needed for moderate pain ((score 4 to 6))., Disp: 30 tablet, Rfl: 0   loperamide (IMODIUM) 2 MG capsule, Take 2 mg by mouth 4 (four) times daily as needed for diarrhea or loose stools., Disp: , Rfl:    progesterone (PROMETRIUM) 100 MG capsule, Take 100 mg by mouth at bedtime., Disp: , Rfl:    rosuvastatin (CRESTOR) 10 MG tablet, Take 10 mg by mouth at bedtime., Disp: , Rfl:    S-Adenosylmethionine (SAME) 400 MG TABS, Take 400 mg by mouth every evening., Disp: , Rfl:    traMADol (ULTRAM) 50 MG tablet, Take 50 mg by mouth every 6 (six) hours as needed., Disp: , Rfl:    Objective:   There were no vitals filed for this visit.  Physical Exam Vitals and nursing note reviewed.  Constitutional:  Appearance: Normal appearance.  HENT:     Head: Normocephalic and atraumatic.  Cardiovascular:     Rate and Rhythm: Normal rate.     Pulses: Normal pulses.  Pulmonary:     Effort: Pulmonary effort is normal. No respiratory distress.  Abdominal:     General: Abdomen is flat. There is no distension.  Skin:    General: Skin is warm.     Coloration: Skin is not jaundiced.     Findings: No bruising.  Neurological:     General: No focal deficit present.     Mental Status: She is alert and oriented to person, place, and time.  Psychiatric:        Mood and Affect: Mood normal.        Behavior: Behavior normal.        Thought Content: Thought content normal.    Assessment & Plan:  Encounter for counseling  We discussed different kinds of implants and she would like to go with the silicone.  I think she is a good candidate for replacement of her current implants with a mastopexy.  We can provide her with a  quote for that.  She went ahead and got filler and Botox as well and that is listed in the adjacent note.  Pictures were obtained of the patient and placed in the chart with the patient's or guardian's permission.   [place], DO

## 2021-11-24 ENCOUNTER — Telehealth: Payer: Self-pay

## 2021-11-24 NOTE — Telephone Encounter (Signed)
Called patient to follow up with having BL removal of current implants and replaced with silicone.  At this time she is not interested due to cost and felt that it was not necessary to have at this time.  Should patient change her mind, she will call our office.

## 2022-08-25 IMAGING — CR DG LUMBAR SPINE 1V
1 series · 1 of 1 positions shown · non-contrast
Comparison: 05/05/2021

CLINICAL DATA: Intraoperative localization

EXAM:
LUMBAR SPINE - 1 VIEW

[xtable lateral]
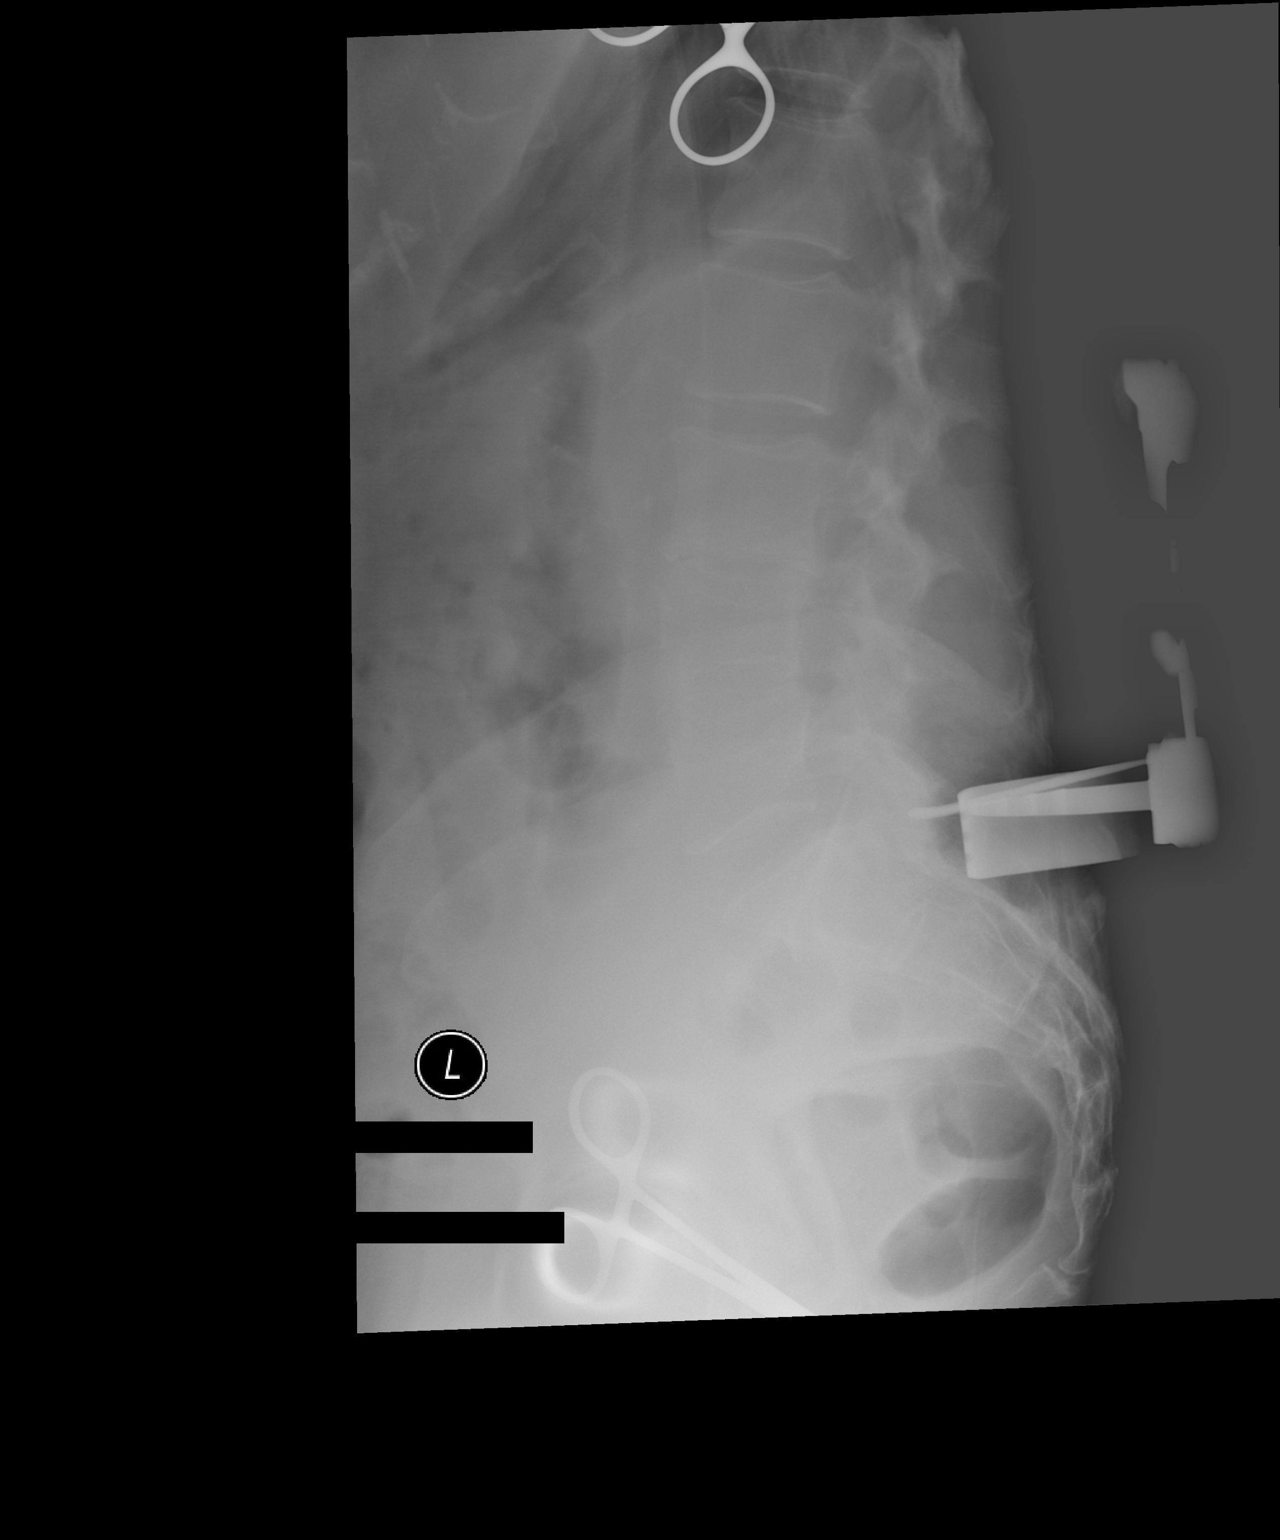

[1 of 1 positions shown; findings below may reference images not displayed]

FINDINGS: Surgical instruments and retractors are noted posteriorly at the
L5-S1 level. Numbering noted min puncture is similar to that used on
prior MRI.
IMPRESSION: Intraoperative localization at L5-S1.

## 2022-12-28 ENCOUNTER — Other Ambulatory Visit: Payer: Self-pay | Admitting: Internal Medicine

## 2022-12-28 ENCOUNTER — Telehealth: Payer: Self-pay | Admitting: Internal Medicine

## 2022-12-28 NOTE — Telephone Encounter (Signed)
Patient called states she urgently needs her Budesonide refills is completely out of them and she has been in the bathroom a lot today. Also stated she will be going out of town for about a month soon.

## 2022-12-29 ENCOUNTER — Other Ambulatory Visit: Payer: Self-pay

## 2022-12-29 ENCOUNTER — Telehealth: Payer: Self-pay

## 2022-12-29 MED ORDER — BUDESONIDE 3 MG PO CPEP: 9.0000 mg | ORAL_CAPSULE | Freq: Every day | ORAL | 2 refills | Status: DC

## 2022-12-29 MED ORDER — BUDESONIDE 3 MG PO CPEP: 9.0000 mg | ORAL_CAPSULE | Freq: Every day | ORAL | 11 refills | Status: DC

## 2022-12-29 NOTE — Telephone Encounter (Signed)
Refill sent to pharmacy.   

## 2022-12-29 NOTE — Telephone Encounter (Signed)
-----  Message from Irene Shipper, MD sent at 12/29/2022  2:18 PM EST ----- Regarding: Refill budesonide Vaughan Basta, Please refill this patient's budesonide and give this patient 1 year of refills on her budesonide. Please call to [facility] on [address]. Thank you JP

## 2022-12-29 NOTE — Telephone Encounter (Signed)
I refilled it for now but patient needs an office visit for further refills.

## 2023-11-09 ENCOUNTER — Ambulatory Visit: Payer: Medicare Other | Attending: Internal Medicine | Admitting: Internal Medicine

## 2023-11-09 ENCOUNTER — Encounter: Payer: Self-pay | Admitting: Internal Medicine

## 2023-11-09 VITALS — BP 128/70 | HR 61 | Ht 63.0 in | Wt 127.0 lb

## 2023-11-09 DIAGNOSIS — Z8679 Personal history of other diseases of the circulatory system: Secondary | ICD-10-CM | POA: Diagnosis not present

## 2023-11-09 DIAGNOSIS — Z13220 Encounter for screening for lipoid disorders: Secondary | ICD-10-CM

## 2023-11-09 DIAGNOSIS — I2510 Atherosclerotic heart disease of native coronary artery without angina pectoris: Secondary | ICD-10-CM | POA: Diagnosis not present

## 2023-11-09 NOTE — Patient Instructions (Signed)
Medication Instructions:   *If you need a refill on your cardiac medications before your next appointment, please call your pharmacy*   Lab Work: Nmr, apo b, lipo a, hepatic  If you have labs (blood work) drawn today and your tests are completely normal, you will receive your results only by: MyChart Message (if you have MyChart) OR A paper copy in the mail If you have any lab test that is abnormal or we need to change your treatment, we will call you to review the results.   Testing/Procedures:  Your physician has requested that you have an echocardiogram. Echocardiography is a painless test that uses sound waves to create images of your heart. It provides your doctor with information about the size and shape of your heart and how well your heart's chambers and valves are working. This procedure takes approximately one hour. There are no restrictions for this procedure. Please do NOT wear cologne, perfume, aftershave, or lotions (deodorant is allowed). Please arrive 15 minutes prior to your appointment time.  Please note: We ask at that you not bring children with you during ultrasound (echo/ vascular) testing. Due to room size and safety concerns, children are not allowed in the ultrasound rooms during exams. Our front office staff cannot provide observation of children in our lobby area while testing is being conducted. An adult accompanying a patient to their appointment will only be allowed in the ultrasound room at the discretion of the ultrasound technician under special circumstances. We apologize for any inconvenience.     Follow-Up: At [facility], you and your health needs are our priority.  As part of our continuing mission to provide you with exceptional heart care, we have created designated Provider Care Teams.  These Care Teams include your primary Cardiologist (physician) and Advanced Practice Providers (APPs -  Physician Assistants and Nurse Practitioners) who all  work together to provide you with the care you need, when you need it.  We recommend signing up for the patient portal called "MyChart".  Sign up information is provided on this After Visit Summary.  MyChart is used to connect with patients for Virtual Visits (Telemedicine).  Patients are able to view lab/test results, encounter notes, upcoming appointments, etc.  Non-urgent messages can be sent to your provider as well.   To learn more about what you can do with MyChart, go to [email or web address].    Your next appointment:  one year

## 2023-11-09 NOTE — Progress Notes (Signed)
Cardiology Office Note   Date:  11/09/2023   ID:  Nakera, Tugman [birth year], MRN [id number]  PCP:  Cleatis Polka., MD  Cardiologist:   Dietrich Pates, MD    [facility]    History of Present Illness: Tammy Barry is a 72 y.o. female with a history of CAD on CT scan   She had a Ca score CT in Aug 2020  Score was 62.6 (LAD)  This wa 72 percentile for age/sex)  CT also showed hepatic steatosis.    Pt wlll get occasional L  parasternal pain and L Latarel CP   Episodes are brief  Not associated with activity  Do not occur often   The pt also notes occsaional skps (isolated) in her heart breat that she will feel in bed  Like a double beat   Only occasional   No dizziness   Active   The pt says she   walks a lot    Pt told she had a murmur as a child   Also had rheumatic fever as a child  FHx  Mother had high chol, smoker   had stent in her 53s       Current Meds  Medication Sig   Azelastine HCl 137 MCG/SPRAY SOLN Place into both nostrils daily at 6 (six) AM.   budesonide (ENTOCORT EC) 3 MG 24 hr capsule Take 3 capsules (9 mg total) by mouth daily.   budesonide-formoterol (SYMBICORT) 160-4.5 MCG/ACT inhaler Inhale 2 puffs into the lungs 2 (two) times daily.   carboxymethylcellulose (REFRESH PLUS) 0.5 % SOLN Place 1-2 drops into both eyes 3 (three) times daily as needed (dry/irritated eyes).   diclofenac Sodium (VOLTAREN) 1 % GEL Apply 1 application topically 4 (four) times daily as needed (pain.).   ezetimibe (ZETIA) 10 MG tablet Take 10 mg by mouth daily.   furosemide (LASIX) 20 MG tablet Take 20 mg by mouth daily.   Lifitegrast (XIIDRA) 5 % SOLN Apply to eye as needed (dry eyes).   montelukast (SINGULAIR) 10 MG tablet Take 10 mg by mouth daily.   progesterone (PROMETRIUM) 100 MG capsule Take 100 mg by mouth at bedtime.   S-Adenosylmethionine (SAME) 400 MG TABS Take 400 mg by mouth every evening.     Allergies:   Codeine   Past  Medical History:  Diagnosis Date   Arthritis    hands/knees/back   Asthma    uses inhalers prn   Candida esophagitis ([facility])    Collagenous colitis    Colon polyp    Hypercholesterolemia    Internal hemorrhoids    Mitral valve prolapse    Never had any problems per patient 05/23/21    Past Surgical History:  Procedure Laterality Date   COLONOSCOPY     elbow surgery  09/05/2020   tendon repair   EYE SURGERY Bilateral    cataracts removed   LUMBAR LAMINECTOMY/DECOMPRESSION MICRODISCECTOMY Bilateral 05/26/2021   Procedure: Laminectomy for facet/synovial cyst - bilateral - Lumbar five-Sacral one;  Surgeon: Julio Sicks, MD;  Location: [facility] OR;  Service: Neurosurgery;  Laterality: Bilateral;   POLYPECTOMY     TUBAL LIGATION     VAGINAL HYSTERECTOMY     WISDOM TOOTH EXTRACTION       Social History:  The patient  reports that she has never smoked. She has never used smokeless tobacco. She reports current alcohol use of about 14.0 standard drinks of alcohol per week. She reports that she  does not use drugs.   Family History:  The patient's family history includes Colitis in her mother and sister; Diabetes in an other family member; Lung cancer in her father and mother.    ROS:  Please see the history of present illness. All other systems are reviewed and  Negative to the above problem except as noted.    PHYSICAL EXAM: VS:  BP 128/70   Pulse 61   Ht 5\' 3"  (1.6 m)   Wt 127 lb (57.6 kg)   SpO2 98%   BMI 22.50 kg/m   GEN: Well nourished, well developed, in no acute distress  HEENT: normal  Neck: no JVD, no carotid bruits Cardiac: RRR; no murmurs  No LE edema 2+ DP  pulses   Respiratory:  clear to auscultation bilaterally, GI: soft, nontenderNo hepatomegaly    EKG:  EKG is ordered today.  SInus rhythm  61 bpm     Lipid Panel No results found for: "CHOL", "TRIG", "HDL", "CHOLHDL", "VLDL", "LDLCALC", "LDLDIRECT"    Wt Readings from Last 3 Encounters:  11/09/23 127 lb (57.6  kg)  06/10/21 122 lb (55.3 kg)  05/30/21 122 lb (55.3 kg)      ASSESSMENT AND PLAN:  1  CAD  pt with Ca score CT  2020  Score 62    I do not think she is having symptoms    Follow   2 Lipids   REcently started on Zetia    Did not tolerate statin   Will check lipomed and liver and apoB and Lpa      3  Hx of rheumatic fever   I do not hear a murmur  Reported to have one in the [pst   Will get an echo to confirm valve function  4  Metabolics   Encouraged her to stay active       Current medicines are reviewed at length with the patient today.  The patient does not have concerns regarding medicines.  Signed, Dietrich Pates, MD  11/09/2023 10:25 AM    [facility] [address] [place], [place], [place]  [address] Phone: [phone]; Fax: [phone]

## 2023-11-10 LAB — HEPATIC FUNCTION PANEL
ALT: 21 [IU]/L (ref 0–32)
AST: 21 [IU]/L (ref 0–40)
Albumin: 4.2 g/dL (ref 3.8–4.8)
Alkaline Phosphatase: 56 [IU]/L (ref 44–121)
Bilirubin Total: 0.4 mg/dL (ref 0.0–1.2)
Bilirubin, Direct: 0.16 mg/dL (ref 0.00–0.40)
Total Protein: 6.6 g/dL (ref 6.0–8.5)

## 2023-11-10 LAB — NMR, LIPOPROFILE
Cholesterol, Total: 237 mg/dL — ABNORMAL HIGH (ref 100–199)
HDL Particle Number: 50.6 umol/L (ref >=30.5)
HDL-C: 87 mg/dL (ref >39)
LDL Particle Number: 1462 nmol/L — ABNORMAL HIGH (ref <1000)
LDL Size: 21.3 nmol (ref >20.5)
LDL-C (NIH Calc): 137 mg/dL — ABNORMAL HIGH (ref 0–99)
LP-IR Score: <25 (ref <=45)
Small LDL Particle Number: 338 nmol/L (ref <=527)
Triglycerides: 75 mg/dL (ref 0–149)

## 2023-11-10 LAB — LIPOPROTEIN A (LPA): Lipoprotein (a): 24.5 nmol/L (ref <75.0)

## 2023-11-10 LAB — APOLIPOPROTEIN B: Apolipoprotein B: 102 mg/dL — ABNORMAL HIGH (ref <90)

## 2023-11-15 ENCOUNTER — Other Ambulatory Visit: Payer: Self-pay

## 2023-11-15 DIAGNOSIS — Z79899 Other long term (current) drug therapy: Secondary | ICD-10-CM

## 2023-11-15 DIAGNOSIS — E7801 Familial hypercholesterolemia: Secondary | ICD-10-CM

## 2023-11-15 DIAGNOSIS — I2510 Atherosclerotic heart disease of native coronary artery without angina pectoris: Secondary | ICD-10-CM

## 2023-11-15 DIAGNOSIS — E7841 Elevated Lipoprotein(a): Secondary | ICD-10-CM

## 2023-11-15 MED ORDER — NEXLETOL 180 MG PO TABS: 180.0000 mg | ORAL_TABLET | Freq: Every day | ORAL | 3 refills | Status: AC

## 2023-11-16 ENCOUNTER — Telehealth: Payer: Self-pay | Admitting: Internal Medicine

## 2023-11-16 NOTE — Telephone Encounter (Signed)
Pt c/o medication issue:  1. Name of Medication:  ezetimibe (ZETIA) 10 MG tablet   2. How are you currently taking this medication (dosage and times per day)?   3. Are you having a reaction (difficulty breathing--STAT)?   4. What is your medication issue? Patient states they are needing PA for this medication and they are leaving for [place] tomorrow morning. She states this is needed asap.

## 2023-11-17 ENCOUNTER — Other Ambulatory Visit (HOSPITAL_COMMUNITY): Payer: Self-pay

## 2023-11-17 ENCOUNTER — Telehealth: Payer: Self-pay | Admitting: Pharmacy Technician

## 2023-11-17 NOTE — Telephone Encounter (Signed)
Pharmacy Patient Advocate Encounter   Received notification from Pt Calls Messages that prior authorization for zetia is required/requested.   Insurance verification completed.   The patient is insured through [facility] .   Per test claim: PA required; PA submitted to above mentioned insurance via CoverMyMeds Key/confirmation #/EOC [id number] Status is pending

## 2023-11-20 ENCOUNTER — Other Ambulatory Visit (HOSPITAL_COMMUNITY): Payer: Self-pay

## 2023-11-23 ENCOUNTER — Other Ambulatory Visit (HOSPITAL_COMMUNITY): Payer: Self-pay

## 2023-11-23 NOTE — Telephone Encounter (Signed)
Sent over  extra documentation on statin intolerance.

## 2023-11-28 ENCOUNTER — Encounter: Payer: Self-pay | Admitting: Internal Medicine

## 2023-11-29 ENCOUNTER — Telehealth: Payer: Self-pay

## 2023-11-29 ENCOUNTER — Other Ambulatory Visit (HOSPITAL_COMMUNITY): Payer: Self-pay

## 2023-11-29 NOTE — Telephone Encounter (Signed)
Per test claim, drug needs a PA. PA request has been Submitted. New Encounter created for follow up. For additional info see Pharmacy Prior Auth telephone encounter from 11/29/23.

## 2023-11-29 NOTE — Telephone Encounter (Signed)
Pharmacy Patient Advocate Encounter  Received notification from [facility] that Prior Authorization for NEXLETOL has been APPROVED from 11/29/23 to 05/29/24. Ran test claim, Copay is $47. This test claim was processed through [facility]- copay amounts may vary at other pharmacies due to pharmacy/plan contracts, or as the patient moves through the different stages of their insurance plan.

## 2023-11-29 NOTE — Telephone Encounter (Signed)
Pharmacy Patient Advocate Encounter   Received notification from Physician's Office that prior authorization for NEXLETOL is required/requested.   Insurance verification completed.   The patient is insured through [facility] .   Per test claim: PA required; PA submitted to above mentioned insurance via CoverMyMeds Key/confirmation #/EOC [facility] Status is pending

## 2023-12-01 ENCOUNTER — Other Ambulatory Visit (HOSPITAL_COMMUNITY): Payer: Self-pay

## 2023-12-06 ENCOUNTER — Telehealth: Payer: Self-pay | Admitting: Internal Medicine

## 2023-12-06 ENCOUNTER — Other Ambulatory Visit (HOSPITAL_COMMUNITY): Payer: Self-pay

## 2023-12-06 NOTE — Telephone Encounter (Signed)
*  STAT* If patient is at the pharmacy, call can be transferred to refill team.   1. Which medications need to be refilled? (please list name of each medication and dose if known)  Nexletol- she thinks prior authorization is approved   2. Would you like to learn more about the convenience, safety, & potential cost savings by using the [facility]?     3. Are you open to using the [facility] (Type [facility].    4. Which pharmacy/location (including street and city if local pharmacy) is medication to be sent to? [facility], [place]   5. Do they need a 30 day or 90 day supply? 90 days and refills

## 2023-12-06 NOTE — Telephone Encounter (Signed)
Pt's medication was sent to pt's pharmacy as requested. Confirmation received.  °

## 2023-12-09 ENCOUNTER — Other Ambulatory Visit (HOSPITAL_COMMUNITY): Payer: Self-pay

## 2023-12-10 ENCOUNTER — Other Ambulatory Visit (HOSPITAL_COMMUNITY): Payer: Self-pay

## 2023-12-13 ENCOUNTER — Other Ambulatory Visit (HOSPITAL_COMMUNITY): Payer: Self-pay

## 2023-12-17 ENCOUNTER — Other Ambulatory Visit (HOSPITAL_COMMUNITY): Payer: Self-pay

## 2023-12-21 ENCOUNTER — Ambulatory Visit (HOSPITAL_COMMUNITY): Payer: Medicare Other | Attending: Cardiology

## 2023-12-21 DIAGNOSIS — Z8679 Personal history of other diseases of the circulatory system: Secondary | ICD-10-CM | POA: Diagnosis not present

## 2023-12-21 DIAGNOSIS — I2510 Atherosclerotic heart disease of native coronary artery without angina pectoris: Secondary | ICD-10-CM | POA: Diagnosis present

## 2023-12-21 DIAGNOSIS — Z13220 Encounter for screening for lipoid disorders: Secondary | ICD-10-CM

## 2023-12-21 LAB — ECHOCARDIOGRAM COMPLETE
Area-P 1/2: 2.99 cm2
MV M vel: 5.23 m/s
MV Peak grad: 109.4 mm[Hg]
Radius: 0.5 cm
S' Lateral: 2.9 cm

## 2023-12-22 ENCOUNTER — Other Ambulatory Visit (HOSPITAL_COMMUNITY): Payer: Self-pay

## 2023-12-22 NOTE — Telephone Encounter (Signed)
 Test claim goes as too soon until 12/28/23- will try again then to verify that PA went through

## 2023-12-23 ENCOUNTER — Telehealth: Payer: Self-pay | Admitting: Internal Medicine

## 2023-12-23 NOTE — Telephone Encounter (Signed)
Returned pt's call. Results given.  

## 2023-12-23 NOTE — Telephone Encounter (Signed)
Pt returning call for echo results  

## 2023-12-28 ENCOUNTER — Other Ambulatory Visit (HOSPITAL_COMMUNITY): Payer: Self-pay

## 2023-12-28 NOTE — Telephone Encounter (Signed)
 Pharmacy Patient Advocate Encounter  Received notification from [employer] that Prior Authorization for zetia has been APPROVED from 12/14/23 to 12/13/24. Ran test claim, Copay is $0.00 one month. This test claim was processed through [facility]- copay amounts may vary at other pharmacies due to pharmacy/plan contracts, or as the patient moves through the different stages of their insurance plan.   PA #/Case ID/Reference #: [id number]

## 2023-12-29 NOTE — Telephone Encounter (Signed)
 LVM for pt regarding PA approval. MyChart message with the information also sent to pt.

## 2024-02-08 ENCOUNTER — Other Ambulatory Visit: Payer: Self-pay | Admitting: Internal Medicine

## 2024-02-11 MED ORDER — BUDESONIDE 3 MG PO CPEP: 9.0000 mg | ORAL_CAPSULE | Freq: Every day | ORAL | 1 refills | Status: DC

## 2024-02-11 NOTE — Telephone Encounter (Signed)
 Spoke with patient and told her since she scheduled an office visit I would be happy to refill her Budesonide.  Sent to [facility].

## 2024-02-11 NOTE — Telephone Encounter (Signed)
 Patient called and stated that her refill was denied and was needing it approved. Patient stated that she is leaving Monday out of town and was also needing this medication urgently. Patient is requesting a call back. Please advise.

## 2024-03-22 ENCOUNTER — Ambulatory Visit: Payer: Medicare Other | Admitting: Physician Assistant

## 2024-03-22 ENCOUNTER — Encounter: Payer: Self-pay | Admitting: Physician Assistant

## 2024-03-22 VITALS — BP 122/62 | HR 61 | Ht 63.0 in | Wt 128.0 lb

## 2024-03-22 DIAGNOSIS — K52831 Collagenous colitis: Secondary | ICD-10-CM | POA: Diagnosis not present

## 2024-03-22 DIAGNOSIS — Z860101 Personal history of adenomatous and serrated colon polyps: Secondary | ICD-10-CM | POA: Diagnosis not present

## 2024-03-22 MED ORDER — BUDESONIDE 3 MG PO CPEP: 3.0000 mg | ORAL_CAPSULE | Freq: Three times a day (TID) | ORAL | 3 refills | Status: AC

## 2024-03-22 NOTE — Progress Notes (Signed)
 Noted.

## 2024-03-22 NOTE — Patient Instructions (Signed)
 We have sent the following medications to your pharmacy for you to pick up at your convenience: budesonide   _______________________________________________________  If your blood pressure at your visit was 140/90 or greater, please contact your primary care physician to follow up on this.  _______________________________________________________  If you are age 72 or older, your body mass index should be between 23-30. Your Body mass index is 22.67 kg/m. If this is out of the aforementioned range listed, please consider follow up with your Primary Care Provider.  If you are age 36 or younger, your body mass index should be between 19-25. Your Body mass index is 22.67 kg/m. If this is out of the aformentioned range listed, please consider follow up with your Primary Care Provider.   ________________________________________________________  The [facility] providers would like to encourage you to use [facility] to communicate with providers for non-urgent requests or questions.  Due to long hold times on the telephone, sending your provider a message by [facility] may be a faster and more efficient way to get a response.  Please allow 48 business hours for a response.  Please remember that this is for non-urgent requests.  _______________________________________________________

## 2024-03-22 NOTE — Progress Notes (Signed)
 Chief Complaint: History of microscopic colitis  HPI:    Tammy Barry is a 73 year old female with a past medical history as listed below including collagenous colitis, known to Dr. Marina Goodell, who presents to clinic today for follow-up of her microscopic colitis.    Colonoscopy June 2011 with collagenous colitis in [place].    06/06/2020 patient saw Dr. Marina Goodell for follow-up.  At that time taking Budesonide 3 mg daily and an extra 1 if she is going to be out in public.  Doing well.    06/10/2021 colonoscopy done for personal history of nonadvanced adenoma and microscopic colitis with a 3 mm polyp in the transverse colon, sigmoid diverticulosis and internal hemorrhoids. Biopsy showed collagenous colitis and adenoma.  Repeat recommended 5 years.    Today, the patient presents to clinic accompanied by her husband, they are personal friends with Dr. Marina Goodell.  She is doing well as long as she uses her Budesonide 3 mg 3 times daily.  She will often have 3-4 bowel movements in the morning and then if she eats something that is wrong she will sometimes have upset as well but otherwise fairly controlled.  Denies any new complaints or concerns.    Denies fever, chills, weight loss, nausea or vomiting.  Past Medical History:  Diagnosis Date   Arthritis    hands/knees/back   Asthma    uses inhalers prn   Candida esophagitis (HCC)    Collagenous colitis    Colon polyp    Hypercholesterolemia    Internal hemorrhoids    Mitral valve prolapse    Never had any problems per patient 05/23/21    Past Surgical History:  Procedure Laterality Date   COLONOSCOPY     elbow surgery  09/05/2020   tendon repair   EYE SURGERY Bilateral    cataracts removed   LUMBAR LAMINECTOMY/DECOMPRESSION MICRODISCECTOMY Bilateral 05/26/2021   Procedure: Laminectomy for facet/synovial cyst - bilateral - Lumbar five-Sacral one;  Surgeon: Julio Sicks, MD;  Location: [facility] OR;  Service: Neurosurgery;  Laterality: Bilateral;   POLYPECTOMY      TUBAL LIGATION     VAGINAL HYSTERECTOMY     WISDOM TOOTH EXTRACTION      Current Outpatient Medications  Medication Sig Dispense Refill   Azelastine HCl 137 MCG/SPRAY SOLN Place into both nostrils daily at 6 (six) AM.     Bempedoic Acid (NEXLETOL) 180 MG TABS Take 1 tablet (180 mg total) by mouth daily in the afternoon. 90 tablet 3   budesonide (ENTOCORT EC) 3 MG 24 hr capsule Take 3 capsules (9 mg total) by mouth daily. 270 capsule 1   budesonide-formoterol (SYMBICORT) 160-4.5 MCG/ACT inhaler Inhale 2 puffs into the lungs 2 (two) times daily.     carboxymethylcellulose (REFRESH PLUS) 0.5 % SOLN Place 1-2 drops into both eyes 3 (three) times daily as needed (dry/irritated eyes).     diclofenac Sodium (VOLTAREN) 1 % GEL Apply 1 application topically 4 (four) times daily as needed (pain.).     ezetimibe (ZETIA) 10 MG tablet Take 10 mg by mouth daily.     furosemide (LASIX) 20 MG tablet Take 20 mg by mouth daily.     Lifitegrast (XIIDRA) 5 % SOLN Apply to eye as needed (dry eyes).     montelukast (SINGULAIR) 10 MG tablet Take 10 mg by mouth daily.     progesterone (PROMETRIUM) 100 MG capsule Take 100 mg by mouth at bedtime.     S-Adenosylmethionine (SAME) 400 MG TABS Take 400 mg by  mouth every evening.     No current facility-administered medications for this visit.    Allergies as of 03/22/2024 - Review Complete 03/22/2024  Allergen Reaction Noted   Codeine Nausea And Vomiting 08/26/2017    Family History  Problem Relation Age of Onset   Lung cancer Mother    Colitis Mother        microscopic   Lung cancer Father    Colitis Sister    Diabetes Other        on fathers side-unknown who    Social History   Socioeconomic History   Marital status: Married    Spouse name: Not on file   Number of children: 2   Years of education: Not on file   Highest education level: Not on file  Occupational History   Occupation: retired  Tobacco Use   Smoking status: Never   Smokeless  tobacco: Never  Vaping Use   Vaping status: Never Used  Substance and Sexual Activity   Alcohol use: Yes    Alcohol/week: 14.0 standard drinks of alcohol    Types: 14 Standard drinks or equivalent per week    Comment: vodka-2 per day   Drug use: No   Sexual activity: Not on file    Comment: Hysterectomy  Other Topics Concern   Not on file  Social History Narrative   Not on file   Social Drivers of Health   Financial Resource Strain: Not on file  Food Insecurity: Not on file  Transportation Needs: Not on file  Physical Activity: Not on file  Stress: Not on file  Social Connections: Unknown (04/28/2022)   Received from [facility], [facility]   Social Network    Social Network: Not on file  Intimate Partner Violence: Unknown (03/20/2022)   Received from [employer], [facility]   HITS    Physically Hurt: Not on file    Insult or Talk Down To: Not on file    Threaten Physical Harm: Not on file    Scream or Curse: Not on file    Review of Systems:    Constitutional: No weight loss, fever or chills Cardiovascular: No chest pain Respiratory: No SOB  Gastrointestinal: See HPI and otherwise negative Genitourinary: No dysuria Neurological: No headache, dizziness or syncope Musculoskeletal: No new muscle or joint pain Hematologic: No bleeding  Psychiatric: No history of depression or anxiety   Physical Exam:  Vital signs: BP 122/62   Pulse 61   Ht 5\' 3"  (1.6 m)   Wt 128 lb (58.1 kg)   BMI 22.67 kg/m    Constitutional:   Pleasant elderly Caucasian female appears to be in NAD, Well developed, Well nourished, alert and cooperative Respiratory: Respirations even and unlabored. Lungs clear to auscultation bilaterally.   No wheezes, crackles, or rhonchi.  Cardiovascular: Normal S1, S2. No MRG. Regular rate and rhythm. No peripheral edema, cyanosis or pallor.  Gastrointestinal:  Soft, nondistended, nontender. No rebound or guarding. Normal bowel sounds. No appreciable  masses or hepatomegaly. Rectal:  Not performed.  Psychiatric: Demonstrates good judgement and reason without abnormal affect or behaviors.  RELEVANT LABS AND IMAGING: CBC    Component Value Date/Time   WBC 8.6 05/26/2021 1312   RBC 4.00 05/26/2021 1312   HGB 13.4 05/26/2021 1312   HCT 40.3 05/26/2021 1312   PLT 292 05/26/2021 1312   MCV 100.8 (H) 05/26/2021 1312   MCH 33.5 05/26/2021 1312   MCHC 33.3 05/26/2021 1312   RDW 13.5 05/26/2021 1312  LYMPHSABS 1.6 05/26/2021 1312   MONOABS 0.6 05/26/2021 1312   EOSABS 0.0 05/26/2021 1312   BASOSABS 0.0 05/26/2021 1312    CMP     Component Value Date/Time   NA 135 05/26/2021 1312   K 3.9 05/26/2021 1312   CL 104 05/26/2021 1312   CO2 20 (L) 05/26/2021 1312   GLUCOSE 99 05/26/2021 1312   BUN 15 05/26/2021 1312   CREATININE 0.84 05/26/2021 1312   CALCIUM 9.2 05/26/2021 1312   PROT 6.6 11/09/2023 1102   ALBUMIN 4.2 11/09/2023 1102   AST 21 11/09/2023 1102   ALT 21 11/09/2023 1102   ALKPHOS 56 11/09/2023 1102   BILITOT 0.4 11/09/2023 1102   GFRNONAA >60 05/26/2021 1312    Assessment: 1.  Collagenous colitis: Controlled on Budesonide 3 mg 3 times daily, up-to-date on colonoscopy 2.  History of adenomatous polyps: Last colonoscopy 06/10/2021 with repeat recommended in 5 years  Plan: 1.  Refill Budesonide 3 mg 3 times daily #270 with 3 refills.  Patient can have another year of refills if she is doing well in a year. 2.  Recall colonoscopy in for 06/10/2026 3.  Patient to follow in clinic in 2 years or sooner if necessary.  Hyacinth Meeker, PA-C [facility] 03/22/2024, 2:46 PM  Cc: Cleatis Polka., MD

## 2024-05-01 ENCOUNTER — Telehealth: Payer: Self-pay | Admitting: Pharmacy Technician

## 2024-05-01 ENCOUNTER — Other Ambulatory Visit (HOSPITAL_COMMUNITY): Payer: Self-pay

## 2024-05-01 NOTE — Telephone Encounter (Signed)
 Pharmacy Patient Advocate Encounter  Received notification from [facility] that Prior Authorization for Nexletol  has been APPROVED from 05/01/24 to 12/13/24. Unable to obtain price due to refill too soon rejection, last fill date 03/16/24 next available fill date06/10/25   PA #/Case ID/Reference #: [id number]

## 2024-05-01 NOTE — Telephone Encounter (Signed)
 Pharmacy Patient Advocate Encounter   Received notification from Fax that prior authorization for Nexletol  is required/requested.   Insurance verification completed.   The patient is insured through [place] .   Per test claim: PA required; PA submitted to above mentioned insurance via CoverMyMeds Key/confirmation #/EOC [id number] Status is pending
